# Patient Record
Sex: Male | Born: 1985 | Race: White | Hispanic: No | Marital: Single | Smoking: Never smoker
Health system: Southern US, Community
[De-identification: ages and names within clinical notes are randomized; demographics above are authoritative.]

---

## 2018-05-27 ENCOUNTER — Encounter: Payer: Self-pay | Admitting: Sports Medicine

## 2018-05-27 ENCOUNTER — Ambulatory Visit: Payer: Self-pay

## 2018-05-27 ENCOUNTER — Ambulatory Visit (INDEPENDENT_AMBULATORY_CARE_PROVIDER_SITE_OTHER): Payer: PRIVATE HEALTH INSURANCE | Admitting: Sports Medicine

## 2018-05-27 ENCOUNTER — Ambulatory Visit (INDEPENDENT_AMBULATORY_CARE_PROVIDER_SITE_OTHER): Payer: PRIVATE HEALTH INSURANCE

## 2018-05-27 VITALS — BP 120/76 | HR 59 | Ht 69.0 in | Wt 157.2 lb

## 2018-05-27 DIAGNOSIS — M71331 Other bursal cyst, right wrist: Secondary | ICD-10-CM | POA: Diagnosis not present

## 2018-05-27 DIAGNOSIS — M25531 Pain in right wrist: Secondary | ICD-10-CM | POA: Diagnosis not present

## 2018-05-27 NOTE — Progress Notes (Signed)
PROCEDURE NOTE:  Ultrasound Guided: Injection: Right wrist, synovial cyst Images were obtained and interpreted by myself, Gaspar BiddingMichael Takuma Cifelli, DO  Images have been saved and stored to PACS system. Images obtained on: GE S7 Ultrasound machine    ULTRASOUND FINDINGS:  Synovial cyst measuring 0.25 x 0.66 cm in size across the dorsum of the wrist.  Otherwise no significant osseous irregularities appreciated on ultrasound.  DESCRIPTION OF PROCEDURE:  The patient's clinical condition is marked by substantial pain and/or significant functional disability. Other conservative therapy has not provided relief, is contraindicated, or not appropriate. There is a reasonable likelihood that injection will significantly improve the patient's pain and/or functional impairment.   After discussing the risks, benefits and expected outcomes of the injection and all questions were reviewed and answered, the patient wished to undergo the above named procedure.  Verbal consent was obtained.  The ultrasound was used to identify the target structure and adjacent neurovascular structures. The skin was then prepped in sterile fashion and the target structure was injected under direct visualization using sterile technique as below:  Single injection performed as below: PREP: Alcohol and Ethel Chloride APPROACH:direct, single injection, 25g 1.5 in. INJECTATE: 0.5 cc 0.5% Marcaine and 0.5 cc 80mg /mL DepoMedrol ASPIRATE: None DRESSING: Band-Aid  Post procedural instructions including recommending icing and warning signs for infection were reviewed.    This procedure was well tolerated and there were no complications.   IMPRESSION: Succesful Ultrasound Guided: Injection

## 2018-05-27 NOTE — Progress Notes (Signed)
Christian FellsMichael D. Delorise Shinerigby, DO  [facility] [place] [phone]  Christian SayersMason Delgado - 32 y.o. male MRN [id number]  Date of birth: 1986/01/29  Visit Date: 05/27/2018  PCP: Patient, No Pcp Per   Referred by: No ref. provider found  Scribe(s) for today's visit: Christoper FabianMolly Weber, LAT, ATC  SUBJECTIVE:  Christian SayersMason Delgado is here for New Patient (Initial Visit) (R wrist pain) .    His R wrist pain symptoms INITIALLY: Began a few months ago w/ no known MOI.  He noticed while doing push-ups and now has a hard time being in a loaded, extended wrist position.  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 He has been taking Curaphen, a turmeric supplement.   REVIEW OF SYSTEMS: Denies night time disturbances. Denies fevers, chills, or night sweats. Denies unexplained weight loss. Denies personal history of cancer. Denies changes in bowel or bladder habits. Denies recent unreported falls. Denies new or worsening dyspnea or wheezing. Denies headaches or dizziness.  Reports numbness, tingling or weakness  In the extremities - in the R hand Denies dizziness or presyncopal episodes Denies lower extremity edema    HISTORY & PERTINENT PRIOR DATA:  Prior History reviewed and updated per electronic medical record.  Significant/pertinent history, findings, studies include:  reports that he has never smoked. He has never used smokeless tobacco. No results for input(s): HGBA1C, LABURIC, CREATINE in the  last 8760 hours. No specialty comments available. Problem  Right Wrist Pain    OBJECTIVE:  VS:  HT:5\' 9"  (175.3 cm)   WT:157 lb 3.2 oz (71.3 kg)  BMI:23.2    BP:120/76  HR:(Abnormal) 59bpm  TEMP: ( )  RESP:97 %   PHYSICAL EXAM: Constitutional: WDWN, Non-toxic appearing. Psychiatric: Alert & appropriately interactive.  Not depressed or anxious appearing. Respiratory: No increased work of breathing.  Trachea Midline Eyes: Pupils are equal.  EOM intact without nystagmus.  No scleral icterus  Vascular Exam: warm to touch no edema  upper extremity neuro exam: unremarkable normal strength normal sensation  MSK Exam: Right wrist is overall well aligned he does have a slight radial positive variance.  Is a slight prominence of the dorsum of the wrist without significant focal pain.  He has approximately 10 to 15 degrees of wrist extension limitation on the right compared to the left and slight amount of pain with this.  Negative piano test.  Grip strength is intact.  Radial pulses 2+/4.   ASSESSMENT & PLAN:   1. Right wrist pain   2. Synovial cyst of right wrist     PLAN: X-rays reviewed today that show slight radial positive variance.  MSK ultrasound does reveal a synovial cyst and ultrasound-guided injection performed today.  Compression with body Helix compression sleeve recommended.  Avoidance of significant wrist extension recommended.  Given his underlying radial positive variance avoiding repetitive wrist extension is recommended going forward but this should not prohibit him from participating in his [employer] duties.  Anticipate full resolution of the wrist  synovial cyst with injection and will have him come back in 6 weeks to ensure this.  Recommend compression to be used on a daily basis.  Follow-up: Return in about 6 weeks (around 07/08/2018).      Please see additional documentation for Objective, Assessment and Plan sections. Pertinent additional documentation may  be included in corresponding procedure notes, imaging studies, problem based documentation and patient instructions. Please see these sections of the encounter for additional information regarding this visit.  CMA/ATC served as Neurosurgeon during this visit. History, Physical, and Plan performed by medical provider. Documentation and orders reviewed and attested to.      Christian Mews, DO    [facility] Physician

## 2018-05-27 NOTE — Patient Instructions (Addendum)
You had an injection today.  Things to be aware of after injection are listed below: . You may experience no significant improvement or even a slight worsening in your symptoms during the first 24 to 48 hours.  After that we expect your symptoms to improve gradually over the next 2 weeks for the medicine to have its maximal effect.  You should continue to have improvement out to 6 weeks after your injection. . Dr. Rigby recommends icing the site of the injection for 20 minutes  1-2 times the day of your injection . You may shower but no swimming, tub bath or Jacuzzi for 24 hours. . If your bandage falls off this does not need to be replaced.  It is appropriate to remove the bandage after 4 hours. . You may resume light activities as tolerated unless otherwise directed per Dr. Rigby during your visit  POSSIBLE STEROID SIDE EFFECTS:  Side effects from injectable steroids tend to be less than when taken orally however you may experience some of the symptoms listed below.  If experienced these should only last for a short period of time. Change in menstrual flow  Edema (swelling)  Increased appetite Skin flushing (redness)  Skin rash/acne  Thrush (oral) Yeast vaginitis    Increased sweating  Depression Increased blood glucose levels Cramping and leg/calf  Euphoria (feeling happy)  POSSIBLE PROCEDURE SIDE EFFECTS: The side effects of the injection are usually fairly minimal however if you may experience some of the following side effects that are usually self-limited and will is off on their own.  If you are concerned please feel free to call the office with questions:  Increased numbness or tingling  Nausea or vomiting  Swelling or bruising at the injection site   Please call our office if if you experience any of the following symptoms over the next week as these can be signs of infection:   Fever greater than 100.5F  Significant swelling at the injection site  Significant redness or drainage  from the injection site  If after 2 weeks you are continuing to have worsening symptoms please call our office to discuss what the next appropriate actions should be including the potential for a return office visit or other diagnostic testing.  I recommend you obtained a compression sleeve to help with your joint problems. There are many options on the market however I recommend obtaining a wrist Body Helix compression sleeve.  You can find information (including how to appropriate measure yourself for sizing) can be found at www.Body Helix.com.  Many of these products are health savings account (HSA) eligible.   You can use the compression sleeve at any time throughout the day but is most important to use while being active as well as for 2 hours post-activity.   It is appropriate to ice following activity with the compression sleeve in place.    

## 2018-07-08 ENCOUNTER — Ambulatory Visit (INDEPENDENT_AMBULATORY_CARE_PROVIDER_SITE_OTHER): Payer: PRIVATE HEALTH INSURANCE | Admitting: Sports Medicine

## 2018-07-08 ENCOUNTER — Encounter: Payer: Self-pay | Admitting: Sports Medicine

## 2018-07-08 ENCOUNTER — Telehealth: Payer: Self-pay | Admitting: Sports Medicine

## 2018-07-08 VITALS — BP 100/72 | HR 60 | Ht 69.0 in | Wt 156.0 lb

## 2018-07-08 DIAGNOSIS — M71331 Other bursal cyst, right wrist: Secondary | ICD-10-CM | POA: Diagnosis not present

## 2018-07-08 DIAGNOSIS — M25531 Pain in right wrist: Secondary | ICD-10-CM

## 2018-07-08 MED ORDER — DICLOFENAC SODIUM 2 % TD SOLN: 1.0000 "application " | Freq: Two times a day (BID) | TRANSDERMAL | 0 refills | Status: AC

## 2018-07-08 MED ORDER — DICLOFENAC SODIUM 2 % TD SOLN: 1.0000 "application " | Freq: Two times a day (BID) | TRANSDERMAL | 2 refills | Status: DC

## 2018-07-08 NOTE — Patient Instructions (Signed)
[facility] instructions for [facility], [facility] and [facility]:  Your prescription will be filled through a mail order [facility].  It is typically [facility] but may vary depending on where you live.  You will receive a phone call from them which will typically come from a [phone] phone number.  You must speak directly to them to have this medication filled.  When the Delgado calls, they will need your mailing address (for overnight shipment of the medication) andy they will need payment information if you have a copay (typically no more than $10). If you have not heard from them 2-3 days after your appointment with Dr. Rigby, contact us at the office ([phone]) or through MyChart so we can reach back out to the Delgado.    [facility] instructions: You have been given a sample/prescription for [facility], a topical medication.     You are to apply this gel to your injured body part twice daily (morning and evening).   A little goes a long way so you can use about a pea-sized amount for each area.   Spread this small amount over the area into a thin film and let it dry.   Be sure that you do not rub the gel into your skin for more than 10 or 15 seconds otherwise it can irritate you skin.    Once you apply the gel, please do not put any other lotion or clothing in contact with that area for 30 minutes to allow the gel to absorb into your skin.   Some people are sensitive to the medication and can develop a sunburn-like rash.  If you have only mild symptoms it is okay to continue to use the medication but if you have any breakdown of your skin you should discontinue its use and please let us know.   If you have been written a prescription for [facility], you will receive a pump bottle of this topical gel through a mail order [facility].  The instructions on the bottle will say to apply two pumps twice a day which may be too much gel for your particular area so use the pea-sized amount as your  guide.  

## 2018-07-08 NOTE — Progress Notes (Signed)
Christian FellsMichael D. Delorise Shinerigby, DO  [facility] [place] [phone]  Christian SayersMason [facility] - 32 y.o. male MRN [id number]  Date of birth: 03/15/1986  Visit Date: 07/08/2018  PCP: Patient, No Pcp Per   Referred by: No ref. provider found  Scribe(s) for today's visit: Christian FabianMolly [facility], LAT, ATC  SUBJECTIVE:  Christian SayersMason Delgado is here for Follow-up (R wrist pain) .    His R wrist pain symptoms INITIALLY: Began a few months ago w/ no known MOI.  He noticed while doing push-ups and now has a hard time being in a loaded, extended wrist position.  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 He has been taking Curaphen, a turmeric supplement.  07/08/2018: Compared to the last office visit on 05/27/18, his previously described R wrist pain symptoms are improving w/ noted improved R wrist mobility.  He states that he hasn't tried push-ups over the past 6 weeks. Current symptoms are mild & are nonradiating.  He feels more tightness along the radial and ulnar side of his wrist. Had a R wrist injection at his last visit and responded well to this.  He is taking IBU prn.   REVIEW OF SYSTEMS: Denies night time disturbances. Denies fevers, chills, or night sweats. Denies unexplained weight loss. Denies personal history of cancer. Denies changes in bowel or bladder habits. Denies recent unreported falls. Denies new or worsening dyspnea or wheezing. Denies headaches or dizziness.  Denies numbness, tingling or  weakness  In the extremities.  Denies dizziness or presyncopal episodes Denies lower extremity edema    HISTORY & PERTINENT PRIOR DATA:  Prior History reviewed and updated per electronic medical record.  Significant/pertinent history, findings, studies include:  reports that he has never smoked. He has never used smokeless tobacco. No results for input(s): HGBA1C, LABURIC, CREATINE in the last 8760 hours. No specialty comments available. No problems updated.  OBJECTIVE:  VS:  HT:5\' 9"  (175.3 cm)   WT:156 lb (70.8 kg)  BMI:23.03    BP:100/72  HR:60bpm  TEMP: ( )  RESP:96 %   PHYSICAL EXAM: Constitutional: WDWN, Non-toxic appearing. Psychiatric: Alert & appropriately interactive.  Not depressed or anxious appearing. Respiratory: No increased work of breathing.  Trachea Midline Eyes: Pupils are equal.  EOM intact without nystagmus.  No scleral icterus  Vascular Exam: warm to touch no edema  upper extremity neuro exam: unremarkable normal strength normal sensation  MSK Exam: Right wrist is overall well aligned.  Prior prominence across the dorsum of the wrist is significantly diminished although still slightly larger than left.  There is no significant pain with palpation.  Good grip strength and normal ulnar and radial deviation.  No appreciable reproducible clicking.   PROCEDURES & DATA REVIEWED:  n/a  ASSESSMENT & PLAN:   1. Right wrist pain   2. Synovial cyst of right wrist     PLAN: He is doing significantly better and has had complete resolution of his pain symptoms.  We will have him return  to activities as tolerated with slow progression back to neutral push-ups.  For any residual soreness and pain that may be exacerbated with return to activities he will prescription of Pennsaid to be used as needed at this point his cyst has resolved and there is no contraindications to his participation in [employer].   Follow-up: Return if symptoms worsen or fail to  improve.      Please see additional documentation for Objective, Assessment and Plan sections. Pertinent additional documentation may be included in corresponding procedure notes, imaging studies, problem based documentation and patient instructions. Please see these sections of the encounter for additional information regarding this visit.  CMA/ATC served as Neurosurgeonscribe during this visit. History, Physical, and Plan performed by medical provider. Documentation and orders reviewed and attested to.      Andrena MewsMichael D [name], DO     Sports Medicine Physician

## 2018-07-08 NOTE — Telephone Encounter (Signed)
Copied from CRM [phone]. Topic: Quick Communication - See Telephone Encounter >> Jul 08, 2018  3:49 PM Arlyss Gandyichardson, Sanyia Dini N, NT wrote: CRM for notification. See Telephone encounter for: 07/08/18. Pt states that his insurance will not cover the Diclofenac Sodium (PENNSAID) 2 % SOLN and he would like to see what his other options may be.

## 2018-07-08 NOTE — Telephone Encounter (Signed)
See note

## 2018-07-10 ENCOUNTER — Other Ambulatory Visit: Payer: Self-pay

## 2018-07-10 MED ORDER — DICLOFENAC SODIUM 1 % TD GEL: TRANSDERMAL | 2 refills | Status: DC

## 2018-07-10 NOTE — Telephone Encounter (Signed)
Spoke with Marlene BastMason and advised that Dr. Berline Choughigby has sent in new rx for topical voltaren gel to his local pharmacy. I also advised that insurance may not cover this either but he can check goodrx for savings card. He reports that he may or may not pick medication up, he is not having much pain now. No further action needed at this time.

## 2018-07-19 ENCOUNTER — Telehealth: Payer: Self-pay | Admitting: Sports Medicine

## 2018-07-19 NOTE — Telephone Encounter (Signed)
Pt needing a call back to discuss his wrist and using it to keep from future injury. Pt wants to know if Berline ChoughRigby can do some testing labs for him as well. Please call pt back to discuss.

## 2018-07-19 NOTE — Telephone Encounter (Signed)
Patient called and states he missed a call from EminenceMolly .Please call

## 2018-07-19 NOTE — Telephone Encounter (Signed)
See note

## 2018-07-19 NOTE — Telephone Encounter (Signed)
Returned pt's call and LM for him to return call to discuss his wrist as well as lab tests that he would like.

## 2018-08-22 ENCOUNTER — Emergency Department (HOSPITAL_COMMUNITY)
Admission: EM | Admit: 2018-08-22 | Discharge: 2018-08-22 | Disposition: A | Discharge status: Home / Self Care | Payer: No Typology Code available for payment source | Attending: Emergency Medicine | Admitting: Emergency Medicine

## 2018-08-22 ENCOUNTER — Emergency Department (HOSPITAL_COMMUNITY): Payer: No Typology Code available for payment source

## 2018-08-22 ENCOUNTER — Encounter (HOSPITAL_COMMUNITY): Payer: Self-pay | Admitting: *Deleted

## 2018-08-22 ENCOUNTER — Other Ambulatory Visit: Payer: Self-pay

## 2018-08-22 DIAGNOSIS — Z79899 Other long term (current) drug therapy: Secondary | ICD-10-CM | POA: Insufficient documentation

## 2018-08-22 DIAGNOSIS — R002 Palpitations: Secondary | ICD-10-CM | POA: Insufficient documentation

## 2018-08-22 LAB — CBC
HCT: 45.6 % (ref 39.0–52.0)
Hemoglobin: 16.3 g/dL (ref 13.0–17.0)
MCH: 32.5 pg (ref 26.0–34.0)
MCHC: 35.7 g/dL (ref 30.0–36.0)
MCV: 90.8 fL (ref 78.0–100.0)
Platelets: 231 10*3/uL (ref 150–400)
RBC: 5.02 MIL/uL (ref 4.22–5.81)
RDW: 12.6 % (ref 11.5–15.5)
WBC: 4.1 10*3/uL (ref 4.0–10.5)

## 2018-08-22 LAB — POCT I-STAT TROPONIN I: Troponin i, poc: 0 ng/mL (ref 0.00–0.08)

## 2018-08-22 LAB — BASIC METABOLIC PANEL
Anion gap: 10 (ref 5–15)
BUN: 16 mg/dL (ref 6–20)
CO2: 29 mmol/L (ref 22–32)
Calcium: 10 mg/dL (ref 8.9–10.3)
Chloride: 105 mmol/L (ref 98–111)
Creatinine, Ser: 0.97 mg/dL (ref 0.61–1.24)
GFR calc Af Amer: >60 mL/min (ref >60)
GFR calc non Af Amer: >60 mL/min (ref >60)
Glucose, Bld: 79 mg/dL (ref 70–99)
Potassium: 4.1 mmol/L (ref 3.5–5.1)
Sodium: 144 mmol/L (ref 135–145)

## 2018-08-22 NOTE — Discharge Instructions (Signed)
Your work-up in the emergency department today was reassuring.  We avoid use of stimulants such as caffeine.  Should you experience persistent palpitations, we advise follow-up with cardiology.  You may benefit from use of a Holter monitor.  You may return to the emergency department as needed for new or concerning symptoms.

## 2018-08-22 NOTE — ED Triage Notes (Signed)
Pt says last night around 1700 he started feeling fatigued, body aches. About noon today, he started having some palpitations, chest pressure, shortness of breath and lightheadedness- lasting all afternoon.

## 2018-08-22 NOTE — ED Provider Notes (Addendum)
[facility]   CSN: [id number] Arrival date & time: 08/22/18  1804    History   Chief Complaint No chief complaint on file.   HPI Christian [facility] is a 32 y.o. male.  32 year old male presents to the emergency department for evaluation of palpitations.  Patient states that he had onset of palpitations at noon today.  Describes his palpitations as his "heart skipping a beat".  States that this would cause him to reflexively cough.  Symptoms persisted for approximately 5 hours, but began to subside shortly after arriving to the emergency department.  Notes some associated lightheadedness.  Denies any syncope, upper respiratory symptoms, fever.  Further denies use of hormone replacement, recent travel, recent surgeries or hospitalizations.  No unilateral leg swelling or hemoptysis.  No distinct viral illness, though he did have a feeling of fatigue while at work yesterday around 1500 accompanied by body aches.  Has not used any stimulants today.  Never felt that his heart was beating rapidly.  No FHx of ACS or sudden cardiac death.  The history is provided by the patient. No language interpreter was used.    History reviewed. No pertinent past medical history.  Patient Active Problem List   Diagnosis Date Noted  . Right wrist pain 05/27/2018    History reviewed. No pertinent surgical history.      Home Medications    Prior to Admission medications   Medication Sig Start Date End Date Taking? Authorizing Provider  Multiple Vitamin (MULTIVITAMIN WITH MINERALS) TABS tablet Take 1 tablet by mouth daily.   Yes [provider]  diclofenac sodium (VOLTAREN) 1 % GEL Apply topically to affected area qid Patient not taking: Reported on 08/22/2018 07/10/18   Andrena Mews, DO    Family History No family history on file.  Social History Social History   Tobacco Use  . Smoking status: Never Smoker  . Smokeless tobacco: Never  Used  Substance Use Topics  . Alcohol use: Yes  . Drug use: Never     Allergies   Patient has no known allergies.   Review of Systems Review of Systems Ten systems reviewed and are negative for acute change, except as noted in the HPI.    Physical Exam Updated Vital Signs BP 140/86 (BP Location: Right Arm)   Pulse (!) 57   Temp 98 F (36.7 C) (Oral)   Resp 15   Ht 5\' 9"  (1.753 m)   Wt 69.9 kg   SpO2 98%   BMI 22.77 kg/m   Physical Exam  Constitutional: He is oriented to person, place, and time. He appears well-developed and well-nourished. No distress.  Nontoxic appearing and in NAD  HENT:  Head: Normocephalic and atraumatic.  Eyes: Conjunctivae and EOM are normal. No scleral icterus.  Neck: Normal range of motion.  Cardiovascular: Normal rate, regular rhythm and intact distal pulses.  Mild bradycardia  Pulmonary/Chest: Effort normal. No stridor. No respiratory distress. He has no wheezes. He has no rales.  Respirations even and unlabored. Lungs CTAB.  Musculoskeletal: Normal range of motion.  Neurological: He is alert and oriented to person, place, and time. He exhibits normal muscle tone. Coordination normal.  Skin: Skin is warm and dry. No rash noted. He is not diaphoretic. No erythema. No pallor.  Psychiatric: He has a normal mood and affect. His behavior is normal.  Nursing note and vitals reviewed.    ED Treatments / Results  Labs (all labs ordered are listed, but  only abnormal results are displayed) Labs Reviewed  BASIC METABOLIC PANEL  CBC  I-STAT TROPONIN, ED  POCT I-STAT TROPONIN I    EKG ED ECG REPORT   Date: 08/22/2018  Rate: 62  Rhythm: normal sinus rhythm  QRS Axis: normal  Intervals: normal  ST/T Wave abnormalities: normal  Conduction Disutrbances:none  Narrative Interpretation: Normal sinus rhythm  Old EKG Reviewed: none available  I have personally reviewed the EKG tracing and agree with the computerized printout as  noted.   Radiology Dg Chest 2 View  Result Date: 08/22/2018 CLINICAL DATA:  Palpitations and shortness of breath today. EXAM: CHEST - 2 VIEW COMPARISON:  None. FINDINGS: The heart size and mediastinal contours are within normal limits. Both lungs are clear. The visualized skeletal structures are unremarkable. IMPRESSION: No active cardiopulmonary disease. Electronically Signed   By: Sherian Rein M.D.   On: 08/22/2018 19:39    Procedures Procedures (including critical care time)  Medications Ordered in ED Medications - No data to display   Initial Impression / Assessment and Plan / ED Course  I have reviewed the triage vital signs and the nursing notes.  Pertinent labs & imaging results that were available during my care of the patient were reviewed by me and considered in my medical decision making (see chart for details).     32 year old male presents to the emergency department for evaluation of palpitations.  Found to be in normal sinus rhythm on arrival.  Felt as though his heart was "skipping a beat".  Has been stable while on the monitor in the exam room.  Is largely asymptomatic at this time.  Laboratory work-up reassuring without leukocytosis or electrolyte derangements.  Troponin is negative.  Chest x-ray without evidence of acute cardiopulmonary abnormality.  He is PERC negative.  Did experience some fatigue and body aches yesterday which may indicate viral illness, though denies fatigue and body aches today.  Could also be related to stress as patient is currently enrolling in the [employer].  May benefit from outpatient holter monitor, but I do not believe further emergent work up is presently indicated.  Will provide cardiology referral for follow up.  Return precautions discussed and provided. Patient discharged in stable condition with no unaddressed concerns.   Final Clinical Impressions(s) / ED Diagnoses   Final diagnoses:  Palpitations    ED Discharge Orders    None        Antony Madura, PA-C 08/22/18 2311    Antony Madura, PA-C 08/22/18 2312    Benjiman Core, MD 08/22/18 2340

## 2018-08-30 ENCOUNTER — Telehealth: Payer: Self-pay | Admitting: Sports Medicine

## 2018-08-30 NOTE — Telephone Encounter (Signed)
Address the mail to ATTN: Claims Department

## 2018-08-30 NOTE — Telephone Encounter (Signed)
Patient called to discuss his bill he received regarding his 05-27-18 visit and a letter he received from his insurance stating that additional documents are needed for coverage.   I explained that I could call the insurance and communicate with the clinical team on what is needed.   I called and spoke with [employer] and they stated that the medical notes/records from the patient's visit on 05-27-18 is needing to be mailed to the mailing address below as soon as possible. Insurance stated that it takes 30-45 business days to process. Please contact the patient as soon as possible to update on the message above and that the documents have been mailed. Okay to leave a voicemail. Make patient aware that insurance stated that once received it takes 30-45 business days to resolve.   [employer] [address] [place], [place] [address]

## 2018-09-02 NOTE — Telephone Encounter (Signed)
Spoke with pt, he will come by the office to sign ROI form and then we can fax it to HIM at [phone]. ROI form at the front desk for pt to complete.

## 2018-09-02 NOTE — Telephone Encounter (Signed)
Called HIM, was advised that pt will need to sign ROI form to have records sent. If records are being sent to the insurance company directly, this will need to be processed through HIM.

## 2018-11-04 ENCOUNTER — Encounter: Payer: Self-pay | Admitting: Sports Medicine

## 2018-11-04 ENCOUNTER — Ambulatory Visit: Payer: PRIVATE HEALTH INSURANCE | Admitting: Sports Medicine

## 2018-11-04 VITALS — BP 120/80 | Ht 69.0 in | Wt 155.0 lb

## 2018-11-04 DIAGNOSIS — M25562 Pain in left knee: Secondary | ICD-10-CM | POA: Diagnosis not present

## 2018-11-04 DIAGNOSIS — G8929 Other chronic pain: Secondary | ICD-10-CM

## 2018-11-04 NOTE — Progress Notes (Addendum)
   Subjective:    Patient ID: Christian RowanMason Grey Delgado, male    DOB: 1986/07/19, 32 y.o.   MRN: [id number]  HPI Christian Delgado is a previously healthy 32yo white male presenting for L knee pain evaluation. Pt reports this is a chronic issue that dates back about 1.6651yrs. At that time he decided to increase his level of activity as started running more gradually. At that point while running he started to develop lateral and posterior L knee pain. Around that same time he also noticed a sudden "pop" and some localized swelling but did not seek evaluation. 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 He also has been doing formal PT as well. He states he was evaluated by ortho in Feb of this years and XR at that time were neg. PT has been focusing on IT band strengthening. He also reports this past Wednesday he made a sudden pivoting turn and felt another "pop" at work. Denies swelling, locking, catching and knee giving out on him. Ice has helped improve the pain. He tries to avoid medication for pain. The pain is localized not very well but generally is on the lateral and posterior aspect. Pain is sharp and intermittent made worse by squats and running. 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   Review of Systems Constitutional: Negative MSK: As above    Objective:   Physical Exam Alert and well appearing male resting comfortably on exam table MSK: Inspect: No swelling, erythema or obvious deformity Palpation: + TTP over distal IT band and distal biceps femoris where tendon attaches on fibula ROM: L knee flexion and extension intact. Pelvic muscle weakness and abductor muscles weakness noted (positive trendelenberg) Strength: 5/5 BL Neurovascular intact Special tests: + patellar apprehension. McMurray neg. Valgus and varus testing neg. Lachman's neg. Anterior and posterior drawer neg. Ober's neg. Thessaly's  neg  Evaluation of his running gait shows excellent form.  He has a first ray post in his right shoe to correct pronation.  With orthotic in place, his foot strike is neutral.  No limp.       Assessment & Plan:  Christian Delgado is a previously healthy 32yo white male presenting for L knee pain evaluation.  Plan: -MRI L knee to evaluate for meniscus tear vs distal biceps femoris tendinopathy -Ice/heat as directed -Aleve prn for pain -Activity modification as discussed -Continue formal PT for now with exercises aimed at abductor muscles strengthening -Increase activity as tolerated -Education provided  RTC after MRI results obtained.  Staff: Dr. Maree Erieraper  Christian Wilen, MD FP, PGY-3  Patient seen and evaluated with the resident.  I agree with the above plan of care.  Patient has chronic lateral knee pain that has been unresponsive to treatment including several weeks of formal physical therapy.  X-rays at an outside facility were unremarkable.  Based on the chronicity of symptoms, I think it is reasonable to pursue further diagnostic imaging in the form of an MRI specifically to rule out a lateral meniscal tear versus possible distal biceps femoris tendinopathy.  Patient has obvious hip abductor weakness and a positive Trendelenburg on today's exam.  We did show him a few home exercises but I think he would benefit from continued treatment with Dr. Ellamae SiaJohn O'Halloran.  Phone follow-up with MRI findings when available.  We will delineate further treatment based on those findings.

## 2018-11-06 ENCOUNTER — Ambulatory Visit
Admission: RE | Admit: 2018-11-06 | Discharge: 2018-11-06 | Disposition: A | Discharge status: Home / Self Care | Payer: PRIVATE HEALTH INSURANCE | Source: Ambulatory Visit | Attending: Sports Medicine | Admitting: Sports Medicine

## 2018-11-06 DIAGNOSIS — M25562 Pain in left knee: Principal | ICD-10-CM

## 2018-11-06 DIAGNOSIS — G8929 Other chronic pain: Secondary | ICD-10-CM

## 2018-11-11 ENCOUNTER — Telehealth: Payer: Self-pay | Admitting: Sports Medicine

## 2018-11-11 ENCOUNTER — Telehealth: Payer: Self-pay | Admitting: General Practice

## 2018-11-11 NOTE — Telephone Encounter (Signed)
  I spoke with the patient on the phone today after reviewing MRI findings of his left knee.  It is a fairly unremarkable MRI.  I still think his focus of pain is around the distal biceps femoris.  I would like for him to return to [name] for some focused physical therapy in this area.  His physical exam showed that he has pelvic stabilizer weakness which may also be contributing to this.  In addition, I recommended that he return to the office so that we can do a focused ultrasound evaluation of the biceps femoris tendon.  I did reassure him that he may continue with activity using pain as his guide.

## 2018-11-11 NOTE — Telephone Encounter (Signed)
Patient needing MRI results asap please, thanks

## 2018-11-26 ENCOUNTER — Ambulatory Visit: Payer: PRIVATE HEALTH INSURANCE | Admitting: Sports Medicine

## 2018-11-26 ENCOUNTER — Ambulatory Visit: Payer: Self-pay

## 2018-11-26 VITALS — BP 122/78 | Ht 69.0 in | Wt 155.0 lb

## 2018-11-26 DIAGNOSIS — G8929 Other chronic pain: Secondary | ICD-10-CM | POA: Diagnosis not present

## 2018-11-26 DIAGNOSIS — M25532 Pain in left wrist: Secondary | ICD-10-CM | POA: Diagnosis not present

## 2018-11-26 DIAGNOSIS — M25562 Pain in left knee: Secondary | ICD-10-CM

## 2018-11-26 MED ORDER — NITROGLYCERIN 0.2 MG/HR TD PT24: MEDICATED_PATCH | TRANSDERMAL | 1 refills | Status: DC

## 2018-11-26 NOTE — Progress Notes (Signed)
HPI  CC: Left hamstring pain  Christian Delgado is a 32 year old male who presents for follow-up of left hamstring pain.  He was previously seen for this issue on 11/04/2018.  At that time he had an MRI obtained that showed no significant tears of the hamstring.  He states he continues to have pain in his medial knee going up through his hamstrings.  He is working with PT with focus stretching and strengthening of his hip abductors and flexors.  He states this has helped somewhat with the pain.  He takes ibuprofen as needed for pain.  The pain does not radiate to the areas of his body.  He has no new injury to the area.  He has not been running since initial injury.  He also reports left wrist pain.  He states his pain is been going on for around 1 month.  He states he feels a pop in the ulnar side of his wrist when he removes it and supination and pronation.  He states it happens only occasionally.  He sometimes has some associated pain with it.  He states this made worse when he ulnar deviates his wrist.  He has no prior injury to the area.  He is not do any repetitive motions.  He has no numbness tingling in his hand.  He denies any weakness of his hand grip.  See HPI and/or previous note for associated ROS.  Objective: BP 122/78   Ht 5\' 9"  (1.753 m)   Wt 155 lb (70.3 kg)   BMI 22.89 kg/m  Gen: Right-Hand Dominant. NAD, well groomed, a/o x3, normal affect.  CV: Well-perfused. Warm.  Resp: Non-labored.  Neuro: Sensation intact throughout. No gross coordination deficits.  Gait: Nonpathologic posture, unremarkable stride without signs of limp or balance issues.  Left knee exam: No erythema, warmth, swelling noted.  Tenderness palpation over the fibular head on the posterior side.  Full range of motion knee flexion knee extension.  Strength 5 out of 5 throughout testing.  Hamstring testing 5 out of 5.  Negative ligamentous testing.  Left wrist exam: No erythema, warmth, swelling noted.  Tenderness  palpation over the ECU as it crosses the styloid process of the ulna.  Full range of motion in flexion, extension, abduction, abduction of the wrist.  Strength out of 5 throughout testing.  Some pain with resisted extension of the wrist.  Negative Tinel sign.  ULTRASOUND: Knee, left Diagnostic limited ultrasound imaging obtained of patient's left knee.  - Fibular head: biceps femoris tendon visualized at the attachment on the fibular head, with hypoechoic changes and surrounding fluid.  No obvious tear visualized.  IMPRESSION: findings consistent with tendinitis of the biceps femoris tendon.   ULTRASOUND: wrist, left Diagnostic limited ultrasound imaging obtained of patient's left wrist.  - Ulnar styloid: ECU tendon visualized in short axis in the groove of the ulnar styloid.  There was surrounding fluid in a bull's-eye distribution around the ECU tendon.  Tendon visualized on dynamic testing with supination pronation of the wrist, without signs of subluxation.  IMPRESSION: findings consistent with sprain of the ECU tendon of the left wrist.  Assessment and plan: 1.  Tendinitis of the left biceps femoris tendon 2.  Sprain of the ECU of the left hand  We discussed treatment options at today's visit.  We will start him on nitroglycerin patch protocol on his left hamstring injury.  He is to place it over the attachment site at the biceps femoris tendon.  He continue  with PT at this time as well to work on strengthening conditioning of his hip abductors and stabilizers.  For his ECU strain, we will place him in a compression body helix wrist sleeve.  We will see him for follow-up in 6 weeks.  If he has no improvement that time, I will consider re-ultrasounding in comparing to previous results.  Alric QuanBlake Kassidie Hendriks, MD [place] [facility] Fellow 11/26/2018 1:30 PM   Patient seen and evaluated with the sports medicine fellow.  I agree with the above plan of care.  Ultrasound does show some  hypoechoic changes in the deep fibers of the left biceps femoris tendon.  Proceed with treatment as above including topical nitroglycerin and continuing with physical therapy to strengthen hip abductors and pelvic stabilizers.  Follow-up with me again in 6 weeks for reevaluation.  I explained to the patient that this is a chronic problem that will take several weeks before resolving.  He understands.  I recommended no running or jumping until follow-up.  He may swim as symptoms allow.

## 2018-11-26 NOTE — Patient Instructions (Addendum)

## 2019-01-06 ENCOUNTER — Ambulatory Visit: Payer: PRIVATE HEALTH INSURANCE | Admitting: Sports Medicine

## 2019-02-10 ENCOUNTER — Ambulatory Visit: Payer: PRIVATE HEALTH INSURANCE | Admitting: Sports Medicine

## 2019-04-08 ENCOUNTER — Telehealth: Payer: Self-pay | Admitting: Sports Medicine

## 2019-04-08 NOTE — Telephone Encounter (Signed)
  I spoke with Christian Delgado on the phone this morning.  He is still struggling with some posterior left knee pain.  He was last seen in the office on November 26, 2018 and diagnosed with tendinitis of the left biceps femoris tendon at the knee.  Diagnosis was made via ultrasound after an MRI of his knee was unremarkable.  He was working with physical therapy but had to discontinue it due to the COVID-19 pandemic.  He still continues to struggle with pain, especially with running.  Unfortunately, he is without health insurance at this time.  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  I told him that I am happy to see him and reevaluate him and repeat his ultrasound.  In the meantime, I did encourage him to research Nordic hamstring exercises and start trying to do those on a regular basis. Of note, he is continuing to struggle with discomfort from a ganglion cyst in his right wrist.  He would like to meet with a surgeon to discuss cyst excision but, again, he will need to wait until he has acquired health insurance.  I am happy to see him back at whatever time is most convenient for him.

## 2020-06-10 IMAGING — MR MR KNEE*L* W/O CM
7 series · 40 of 40 positions shown · non-contrast
Comparison: None.

CLINICAL DATA: Chronic anterolateral knee pain related to running.
No acute injury or prior relevant surgery.

EXAM:
MRI OF THE LEFT KNEE WITHOUT CONTRAST
TECHNIQUE: Multiplanar, multisequence MR imaging of the knee was performed. No
intravenous contrast was administered.

[Series 5: T2 fat-sat · axial · left · 4.0mm · 0.50mm/px · z∈[-60,+68]mm · 7 of 30 slices shown (1 of 3)]
[im 1/30]
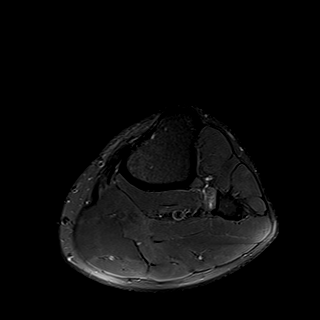
[im 5/30]
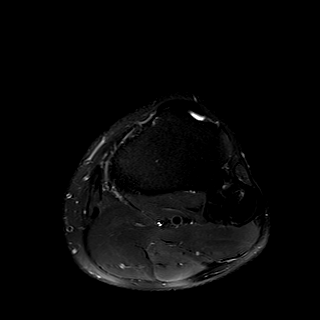
[im 10/30]
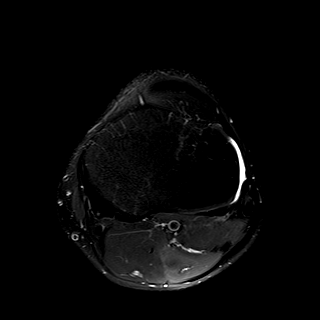
[im 15/30]
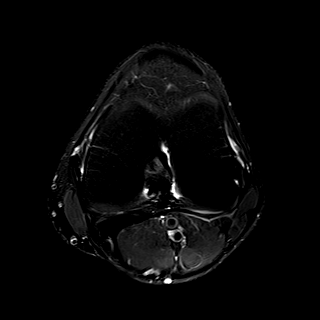
[im 20/30]
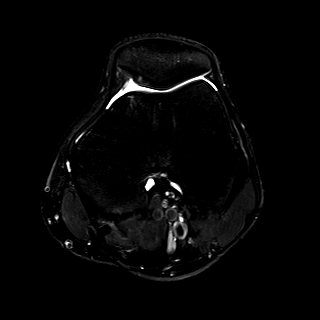
[im 25/30]
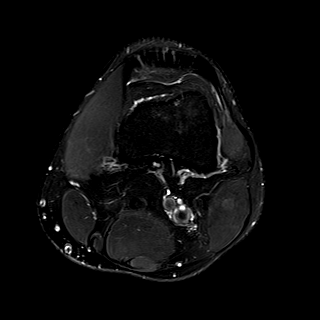
[im 30/30]
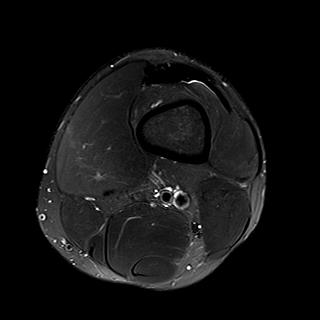

[Series 6: PD fat-sat · sagittal · left · 3.0mm · 0.47mm/px · 6 of 27 slices shown (1 of 2)]
[im 1/27]
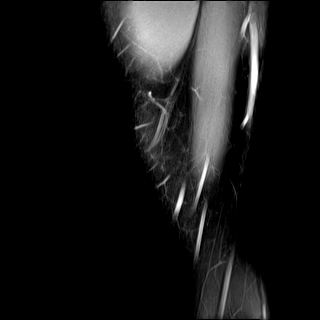
[im 6/27]
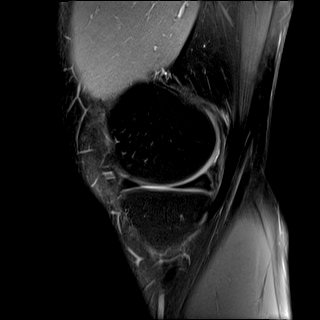
[im 11/27]
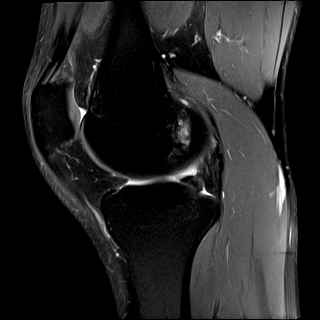
[im 16/27]
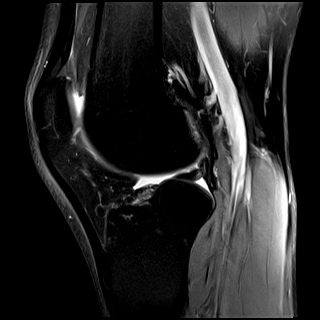
[im 21/27]
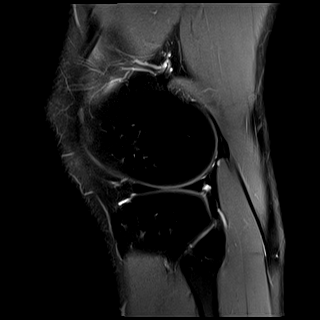
[im 27/27]
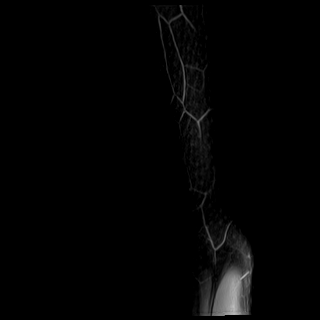

[Series 7: T2 fat-sat · sagittal · left · 3.0mm · 0.47mm/px · 6 of 27 slices shown (2 of 3)]
[im 1/27]
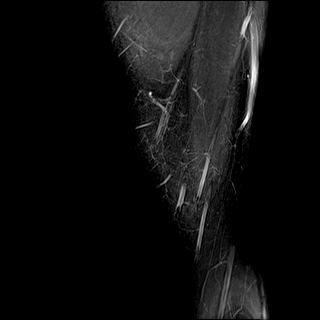
[im 6/27]
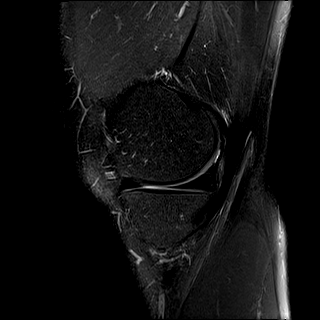
[im 11/27]
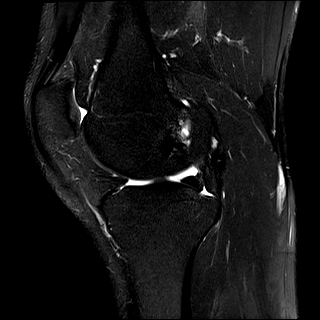
[im 16/27]
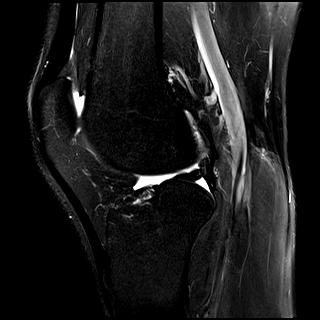
[im 21/27]
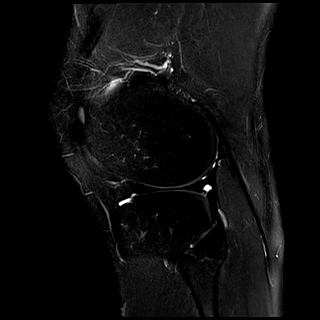
[im 27/27]
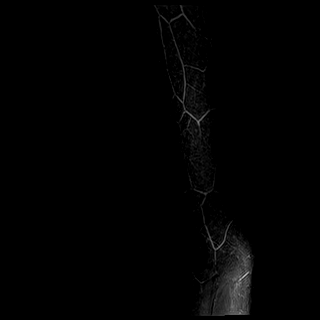

[Series 8: T2 fat-sat · coronal · left · 4.0mm · 0.47mm/px · 6 of 25 slices shown (3 of 3)]
[im 1/25]
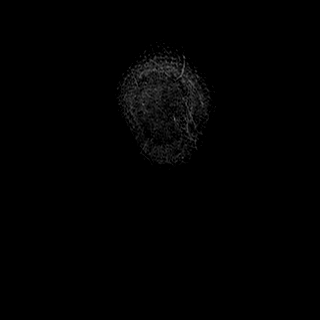
[im 5/25]
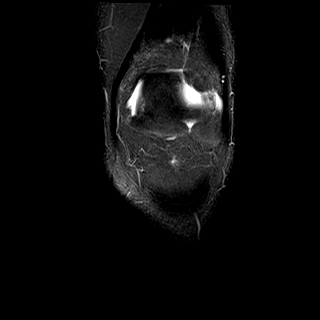
[im 10/25]
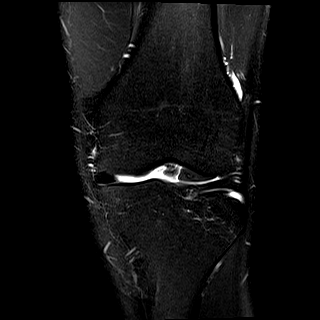
[im 15/25]
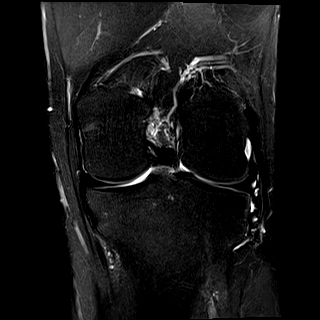
[im 20/25]
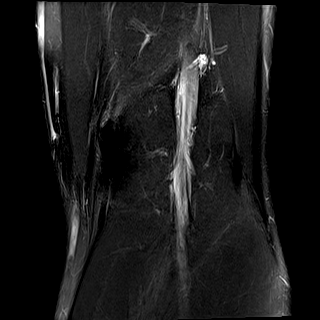
[im 25/25]
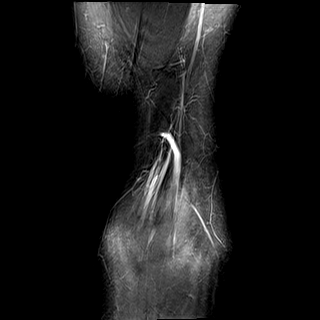

[Series 9: T1 · coronal · left · 4.0mm · 0.47mm/px · 6 of 25 slices shown]
[im 1/25]
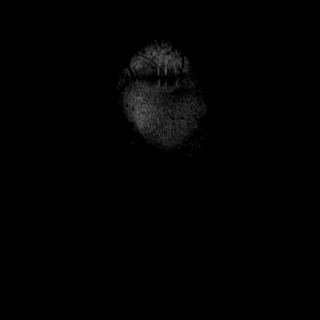
[im 5/25]
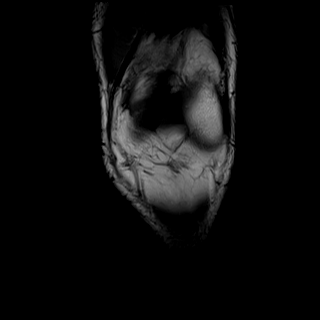
[im 10/25]
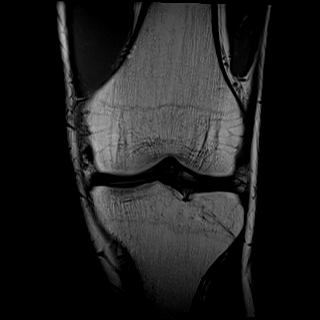
[im 15/25]
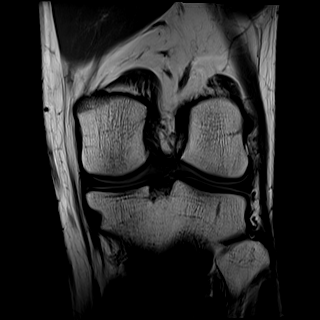
[im 20/25]
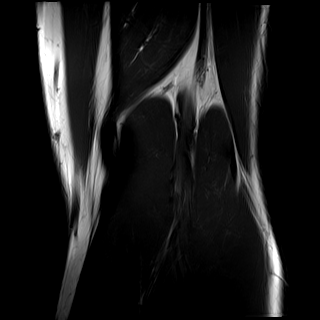
[im 25/25]
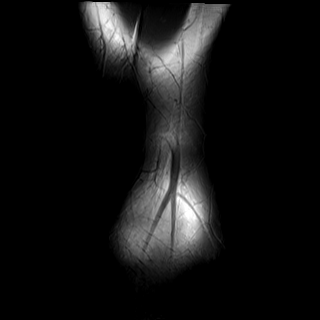

[Series 10: PD fat-sat · coronal · left · 3.0mm · 0.47mm/px · 6 of 28 slices shown (2 of 2)]
[im 1/28]
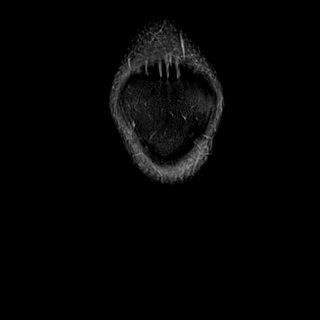
[im 6/28]
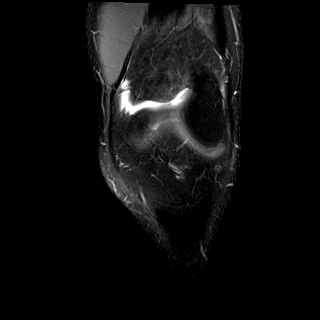
[im 11/28]
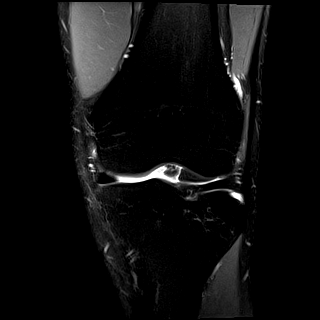
[im 17/28]
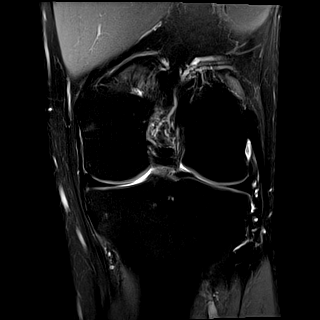
[im 22/28]
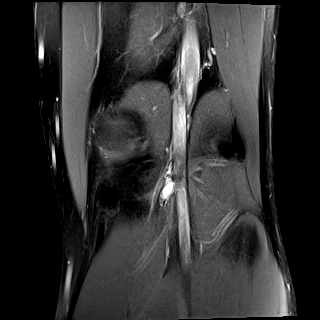
[im 28/28]
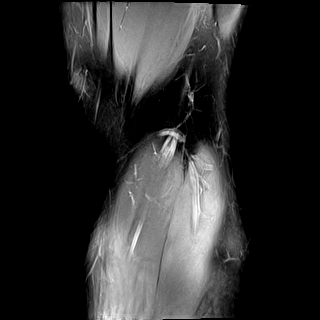

[Series 11: PD · coronal · left · 2.0mm · 0.55mm/px · 3 of 13 slices shown]
[im 1/13]
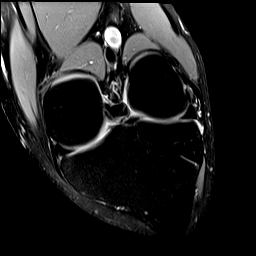
[im 7/13]
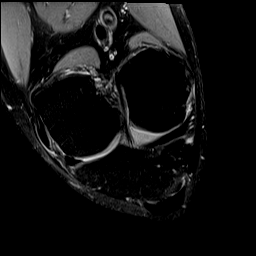
[im 13/13]
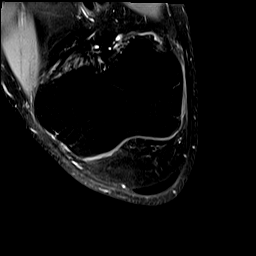

[40 of 40 positions shown; findings below may reference images not displayed]

FINDINGS: MENISCI

Medial meniscus:  Intact with normal morphology.

Lateral meniscus:  Intact with normal morphology.

LIGAMENTS

Cruciates:  Intact.

Collaterals: Intact. There is no thickening of the iliotibial band
or surrounding inflammatory change.

CARTILAGE

Patellofemoral: Possible minimal fissuring of the patellar cartilage
over the medial facet. No subchondral cyst formation.

Medial:  Preserved.

Lateral:  Preserved.

MISCELLANEOUS

Joint:  No significant joint effusion.

Popliteal Fossa:  Unremarkable. No significant Baker's cyst.

Extensor Mechanism:  Intact.

Bones:  No acute or significant extra-articular osseous findings.

Other: No other significant periarticular soft tissue findings.
IMPRESSION: 1. Near normal examination without explanation for the patient's
symptoms. The menisci, cruciate and collateral ligaments are intact.
The iliotibial band appears normal.
2. Possible minimal fissuring of the medial patellar cartilage.

## 2020-07-27 ENCOUNTER — Encounter: Payer: Self-pay | Admitting: Psychiatry

## 2020-07-27 ENCOUNTER — Other Ambulatory Visit: Payer: Self-pay

## 2020-07-27 ENCOUNTER — Ambulatory Visit (INDEPENDENT_AMBULATORY_CARE_PROVIDER_SITE_OTHER): Payer: 59 | Admitting: Psychiatry

## 2020-07-27 DIAGNOSIS — F320 Major depressive disorder, single episode, mild: Secondary | ICD-10-CM | POA: Diagnosis not present

## 2020-07-27 NOTE — Progress Notes (Signed)
[facility] Counselor Initial Adult Exam  Name: Christian Delgado Date: 07/27/2020 MRN: [id number] DOB: Jul 11, 1986 PCP: Patient, No Pcp Per  Time spent: 65 minutes   Guardian/Payee:  None    Paperwork requested:  No   Reason for Visit /Presenting Problem: This is a 34 year old white male who recently has fallen into a major depressive episode.  He has also been having a lot of anxiety with the depression with episodes of anxiety attacks.  He describes having thoughts of death being overwhelmed and angry feelings at wanting to harm others.  He clearly states he has no intent or means to harm others he is just angry at his circumstances.  The client states that his purpose was always to be involved with the [employer].  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  He hurt his hip by pulling his hamstring.  The injury is severe enough that he cannot run without much difficulty at this point.  This has knocked him out of the [employer] which has been disappointing for him.  He has wanted to pursue being a Control and instrumentation engineer so he is applying to an Counsellor school in [place], [place].  He did well and passed the exam with high marks. In the meantime the client had to find a new job to support himself.  He was hired on at the [employer].  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  What he is realized in retrospect that the management environment at his particular store is very toxic.  He sees it as a Sports administrator in nature.  Being bullied and put down are not addressed by the management.  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  He has gotten dressed down by the GM for inconsequential things.  Most recently as the client was starting his day his general manager said something derogatory about him.  The client stated it pushed him over the edge.  He became very overwhelmed and anxious. Goals 1) resolve depression 2) reduce overall anxiety 3)  restructure negative thinking 4) coping skills for depression and anxiety. The client has been treated with EMDR in the past.  Today I used eye-movement with the client focusing on the [facility] management.  His negative cognition is, "I am worthless.  He feels numbness and a fog in his head.  His subjective units of distress is a 9.  As the client processed he became increasingly anxious and sad.  As we processed through his feelings I pointed out that the client has proven numerous times in the past that he has a hard worker that is well regarded.  Much of what has happened at [facility] has tapped in to the bullying he experienced in high school.  The client realized that today.  I suggested that he not let other people with whom he would not even take advice to define who he is.  The client agreed.  His subjective units of distress was less than 5.  The client has also agreed to follow-up with the [facility] medical clinic and talk to the PCP about starting an antidepressant.  He will then follow-up with one of our providers here.  Mental Status Exam:   Appearance:   Well Groomed     Behavior:  Appropriate  Motor:  Normal  Speech/Language:   Clear and Coherent  Affect:  Depressed  Mood:  anxious, depressed and sad  Thought process:  normal  Thought content:    WNL  Sensory/Perceptual disturbances:    WNL  Orientation:  oriented to person, place, time/date and situation  Attention:  Good  Concentration:  Good  Memory:  WNL  Fund of knowledge:   Good  Insight:    Good  Judgment:   Good  Impulse Control:  Good      Reported Symptoms: Anxiety, depression, poor sleep, lack of motivation, anhedonia, thoughts of death, anger, irritability.  Risk Assessment: Danger to Self:  No Self-injurious Behavior: No Danger to Others: No Duty to Warn:no Physical Aggression / Violence:No  Access to Firearms a concern: No  Gang Involvement:No  Patient / guardian was educated about steps to take if suicide or  homicide risk level increases between visits: yes While future psychiatric events cannot be accurately predicted, the patient does not currently require acute inpatient psychiatric care and does not currently meet [facility] involuntary commitment criteria.  Substance Abuse History: Current substance abuse: No     Past Psychiatric History:   Previous psychological history is significant for depression Outpatient Providers:Fred Chaquetta Schlottman, L[facility]CS History of Psych Hospitalization: No  Psychological Testing: None   Abuse History: Victim of No., NA   Report needed: No. Victim of Neglect:No. Perpetrator of NA  Witness / Exposure to Domestic Violence: No   Protective Services Involvement: No  Witness to [employer] Violence:  No   Family History: History reviewed. No pertinent family history.  Living situation: the patient lives alone  Sexual Orientation:  Straight  Relationship Status: single  Name of spouse / other:[name]             If a parent, number of children / ages:0  Support Systems; significant other parents  Financial Stress:  No   Income/Employment/Disability: Employment  Financial planner: Yes   Educational History: Education: Risk manager:   [employer]  Any cultural differences that Feliz Herard affect / interfere with treatment:  not applicable   Recreation/Hobbies: sports, exercise  Stressors:Occupational concerns  Strengths:  Supportive Relationships, Family, Friends, Warehouse manager, Spirituality and Self Advocate  Barriers:  None   Legal History: Pending legal issue / charges: The patient has no significant history of legal issues. History of legal issue / charges: NA  Medical History/Surgical History:not reviewed History reviewed. No pertinent past medical history.  History reviewed. No pertinent surgical history.  Medications: Current Outpatient Medications  Medication Sig Dispense Refill  . Multiple Vitamin (MULTIVITAMIN  WITH MINERALS) TABS tablet Take 1 tablet by mouth daily.    . nitroGLYCERIN (NITRODUR - DOSED IN MG/24 HR) 0.2 mg/hr patch Use 1/4 patch daily to the affected area. 30 patch 1   No current facility-administered medications for this visit.    No Known Allergies  Diagnoses:    ICD-10-CM   1. Major depressive disorder, single episode, mild (HCC)  F32.0     Plan of Care: I will use EMDR to help the client processed through the trauma he has experienced with his work.  We will restructure his negative thoughts and eliminate the negative emotional content as well.  I will teach the client new skills to use and problem-solving strategies.  The client will also follow-up with his primary care physician for medication.  Estimated length of treatment 10-20 sessions.   Gelene Mink Joanthony Hamza, [facility]

## 2020-07-30 ENCOUNTER — Ambulatory Visit (INDEPENDENT_AMBULATORY_CARE_PROVIDER_SITE_OTHER): Payer: 59 | Admitting: Psychiatry

## 2020-07-30 ENCOUNTER — Encounter: Payer: Self-pay | Admitting: Psychiatry

## 2020-07-30 ENCOUNTER — Other Ambulatory Visit: Payer: Self-pay

## 2020-07-30 DIAGNOSIS — F320 Major depressive disorder, single episode, mild: Secondary | ICD-10-CM

## 2020-07-30 NOTE — Progress Notes (Signed)
[facility] Counselor/Therapist Progress Note  Patient ID: Almer Bushey, MRN: [id number],    Date: 07/30/2020  Time Spent: 45 minutes   Treatment Type: Individual Therapy  Reported Symptoms: sad, anxious  Mental Status Exam:  Appearance:   Well Groomed     Behavior:  Appropriate  Motor:  Normal  Speech/Language:   Clear and Coherent  Affect:  Appropriate  Mood:  anxious and sad  Thought process:  normal  Thought content:    WNL  Sensory/Perceptual disturbances:    WNL  Orientation:  oriented to person, place, time/date and situation  Attention:  Good  Concentration:  Good  Memory:  WNL  Fund of knowledge:   Good  Insight:    Good  Judgment:   Good  Impulse Control:  Good   Risk Assessment: Danger to Self:  No Self-injurious Behavior: No Danger to Others: No Duty to Warn:no Physical Aggression / Violence:No  Access to Firearms a concern: No  Gang Involvement:No   Subjective: The client states that he has been offered a job at [employer] in downtown [place].  This is a relief for the client since he does not feel he can return to the toxic work situation at [employer].  He stated he has called [employer] to get the paperwork for short-term leave.  The client also followed up with [facility] and his primary care physician started him on 25 mg of Zoloft with a prescription of hydroxyzine.  He is on day 3 of the Zoloft.  He will call [facility] today to ask about when he can increase the medication.  Today I used eye-movement with the client focusing on his anxiety around the trauma he has experienced at [facility].  His negative cognition is, "I am worthless.  I cannot do anything.  I must be bad."  He feels anxiety in his chest which is at a subjective units of distress of 8.  As the client processed he became sad and tearful at what he experienced with management at [facility].  He states he was dressed down on different occasions by the general manager over minor  issues.  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  When he tried to address it with management his efforts were villainized and the employee who is disrespectful had no consequences.  I discussed with the client how much of this sounds like power and control issues on the part of people that use disrespect as a way to manipulate and control people.  I mention to the client that I have had other people who have worked for [employer] that have experienced similar things.  This was a relief to the client. His subjective units of distress dropped to a 4.  He feels better that he has secured another job. The client still wants to try to get into aviation school in [place], [place].  The cost is around $75,000.  He cannot secure a loan without a cosigner because of his income to debt ratio with student loans.  The client is still hopeful.  He has found the hydroxyzine helpful with his sleep which he will use.  We also discussed the use of radical acceptance in these current circumstances that he has no control over.  His subjective units of distress was less than 4.  Interventions: Assertiveness/Communication, Motivational Interviewing, Solution-Oriented/Positive Psychology, [employer] Desensitization and Reprocessing (EMDR) and Insight-Oriented  Diagnosis:  ICD-10-CM   1. Major depressive disorder, single episode, mild (HCC)  F32.0     Plan: Mood independent behavior, positive self talk, self-care, exercise, follow-up with short-term leave, continue medication, contact PCP, radical acceptance.  Gelene Mink Jhoanna Heyde, [facility]

## 2020-08-30 ENCOUNTER — Telehealth: Payer: Self-pay | Admitting: Psychiatry

## 2020-08-30 NOTE — Telephone Encounter (Signed)
Received fax from [place] on [employer]. [place], DOB 09-21-1986 for leave of absence.

## 2020-09-07 ENCOUNTER — Ambulatory Visit (INDEPENDENT_AMBULATORY_CARE_PROVIDER_SITE_OTHER): Payer: Managed Care, Other (non HMO) | Admitting: Psychiatry

## 2020-09-07 ENCOUNTER — Encounter: Payer: Self-pay | Admitting: Psychiatry

## 2020-09-07 ENCOUNTER — Other Ambulatory Visit: Payer: Self-pay

## 2020-09-07 DIAGNOSIS — F320 Major depressive disorder, single episode, mild: Secondary | ICD-10-CM | POA: Diagnosis not present
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
## 2020-10-13 ENCOUNTER — Ambulatory Visit (INDEPENDENT_AMBULATORY_CARE_PROVIDER_SITE_OTHER): Payer: 59 | Admitting: Psychiatry

## 2020-10-13 ENCOUNTER — Other Ambulatory Visit: Payer: Self-pay

## 2020-10-13 ENCOUNTER — Encounter: Payer: Self-pay | Admitting: Psychiatry

## 2020-10-13 DIAGNOSIS — F320 Major depressive disorder, single episode, mild: Secondary | ICD-10-CM | POA: Diagnosis not present

## 2020-10-13 NOTE — Progress Notes (Signed)
[facility] Counselor/Therapist Progress Note  Patient ID: Christian Delgado, MRN: [id number],    Date: 10/13/2020  Time Spent: 45 minutes   Treatment Type: Individual Therapy  Reported Symptoms: anxiety, sadness  Mental Status Exam:  Appearance:   Casual     Behavior:  Appropriate  Motor:  Normal  Speech/Language:   Clear and Coherent  Affect:  Appropriate  Mood:  anxious and sad  Thought process:  normal  Thought content:    WNL  Sensory/Perceptual disturbances:    WNL  Orientation:  oriented to person, place, time/date and situation  Attention:  Good  Concentration:  Good  Memory:  WNL  Fund of knowledge:   Good  Insight:    Good  Judgment:   Good  Impulse Control:  Good   Risk Assessment: Danger to Self:  No Self-injurious Behavior: No Danger to Others: No Duty to Warn:no Physical Aggression / Violence:No  Access to Firearms a concern: No  Gang Involvement:No   Subjective: The client states that he is recently moved into his own apartment.  He left his last place because his roommate was his ex-girlfriend.  It was not working out with his current girlfriend for him to live there with which he agreed.  He states today that he is better with his issues at [facility].  He understands that, "I am not going back."  As result he does not identify any anxiety. Today he wants to discuss his career path.  This is where the majority of his anxiety comes in along with some sadness.  "I did a career assessment online."  The client states that the results were comprehensive and he felt that they were spot on for him.  It indicated that he might want to pursue a career as a Sport and exercise psychologist.  This resonates with the client and he has begun to explore some different opportunities.  He already has an undergraduate degree but the local university states he would have to repeat all his general education courses again for degree in computer science.  He checked with [facility] and the [employer].  Both offer software boot camps that last about 9 months.  The cost are similar at around $15,000.  He could also go to [employer] and complete a computer science degree and then possibly get a masters.  The biggest problem that he runs in to is his current debt from his undergraduate degree.  He is hesitant to take on more debt although he sees that upon graduation he could easily get a six-figure income.  He wants his stepfather to be on board since he has been very generous and helpful with his financial situation.  I suggested to the client that he lay out each option with the costs of each with the potential income available.  The client agrees that if his stepfather can see the numbers he [place] be more open to being supportive with him.  I used eye-movement with the client through the course of the session reducing his overall anxiety from a subjective units of distress of 5 to less than 2.  Interventions: Assertiveness/Communication, Motivational Interviewing, Solution-Oriented/Positive Psychology, [employer] Desensitization and Reprocessing (EMDR) and Insight-Oriented  Diagnosis:   ICD-10-CM   1. Major depressive disorder, single episode, mild (HCC)  F32.0     Plan: Mood independent behavior, positive self talk, self-care, spreadsheet with the different options for school and financial outcomes, talk to stepdad,  assertiveness, boundaries.  Jalesia Loudenslager, [facility]

## 2020-10-19 ENCOUNTER — Encounter: Payer: Self-pay | Admitting: Psychiatry

## 2020-10-19 ENCOUNTER — Ambulatory Visit (INDEPENDENT_AMBULATORY_CARE_PROVIDER_SITE_OTHER): Payer: 59 | Admitting: Psychiatry

## 2020-10-19 ENCOUNTER — Ambulatory Visit (INDEPENDENT_AMBULATORY_CARE_PROVIDER_SITE_OTHER): Payer: 59 | Admitting: Sports Medicine

## 2020-10-19 ENCOUNTER — Encounter: Payer: Self-pay | Admitting: Sports Medicine

## 2020-10-19 ENCOUNTER — Other Ambulatory Visit: Payer: Self-pay

## 2020-10-19 VITALS — BP 112/72 | Ht 68.0 in | Wt 145.0 lb

## 2020-10-19 DIAGNOSIS — M67431 Ganglion, right wrist: Secondary | ICD-10-CM

## 2020-10-19 DIAGNOSIS — F320 Major depressive disorder, single episode, mild: Secondary | ICD-10-CM | POA: Diagnosis not present
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
## 2020-10-19 NOTE — Patient Instructions (Signed)
We are going to refer you to a hand surgeon for the ganglion cyst on your right wrist.  Dr. Betha Loa [facility] [address], [place] [address] [phone]  Appt: Friday 10/29/20 @ 9 am

## 2020-10-19 NOTE — Progress Notes (Signed)
      [facility] Counselor/Therapist Progress Note  Patient ID: Christian Delgado, MRN: [id number],    Date: 10/19/2020  Time Spent: 50 minutes   Treatment Type: Individual Therapy  Reported Symptoms: anxious, sad  Mental Status Exam:  Appearance:   Well Groomed     Behavior:  Appropriate  Motor:  Normal  Speech/Language:   Clear and Coherent  Affect:  Appropriate  Mood:  anxious and sad  Thought process:  normal  Thought content:    WNL  Sensory/Perceptual disturbances:    WNL  Orientation:  oriented to person, place, time/date and situation  Attention:  Good  Concentration:  Good  Memory:  WNL  Fund of knowledge:   Good  Insight:    Good  Judgment:   Good  Impulse Control:  Good   Risk Assessment: Danger to Self:  No Self-injurious Behavior: No Danger to Others: No Duty to Warn:no Physical Aggression / Violence:No  Access to Firearms a concern: No  Gang Involvement:No   Subjective: The client states he plans on spending the holiday with family in [place] for Thanksgiving.  His girlfriend will be attending with him.  He notes that the last few days he has had a dip in his mood with anxiety and depression.  He is still thinking about his job at [employer].  The other day he went to the [employer] in [place] and found himself triggered.  Today we used eye-movement focusing on the client's trip to [facility].  His negative cognition is, "I am on guard.  I am not getting approval."  He feels anxiety and depression in his chest.  His subjective units of distress is a 7.  As the client processed he realized that he is desperate for approval.  He knows that his father never gave him that and he felt alone and untethered through high school and college.  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  This wounded the client because he thought he had been doing well.  As we discussed it he agreed that he was doing  well.  He pointed out to his manager that one of the other managers was well known for being arrogant.  "I am nothing like that."  His manager actually agreed with him.  As we continue to process, I pointed out to the client that he has a number of good character traits on board.  He is thoughtful, he is hard-working, and he is very responsible.  I had the client focus on the things that he has been successful with as he use the eye-movement.  I explained that he did not have to justify his behavior or get anyone's approval for who he is.  He agreed.  His positive cognition at the end of the session was, "I am enough."  His subjective units of distress was less than 2.  Interventions: Assertiveness/Communication, Motivational Interviewing, Solution-Oriented/Positive Psychology, [employer] Desensitization and Reprocessing (EMDR) and Insight-Oriented  Diagnosis:   ICD-10-CM   1. Major depressive disorder, single episode, mild (HCC)  F32.0     Plan: Positive self talk, mood independent behavior, self-care, exercise, assertiveness, boundaries.  Gelene Mink Shogo Larkey, [facility]

## 2020-10-26 ENCOUNTER — Other Ambulatory Visit: Payer: Self-pay

## 2020-10-26 ENCOUNTER — Ambulatory Visit (INDEPENDENT_AMBULATORY_CARE_PROVIDER_SITE_OTHER): Payer: 59 | Admitting: Psychiatry

## 2020-10-26 ENCOUNTER — Encounter: Payer: Self-pay | Admitting: Psychiatry

## 2020-10-26 ENCOUNTER — Telehealth: Payer: Self-pay | Admitting: Psychiatry

## 2020-10-26 DIAGNOSIS — F320 Major depressive disorder, single episode, mild: Secondary | ICD-10-CM

## 2020-10-26 NOTE — Telephone Encounter (Signed)
Received an FMLA form on [employer] from [facility]. Have given to [facility] to complete.

## 2020-10-26 NOTE — Progress Notes (Signed)
[facility] Counselor/Therapist Progress Note  Patient ID: Jaaziah Schulke, MRN: [id number],    Date: 10/26/2020  Time Spent: 50 minutes   Treatment Type: Individual Therapy  Reported Symptoms: sad, anxious  Mental Status Exam:  Appearance:   Well Groomed     Behavior:  Appropriate  Motor:  Normal  Speech/Language:   Clear and Coherent  Affect:  Appropriate  Mood:  anxious and sad  Thought process:  normal  Thought content:    WNL  Sensory/Perceptual disturbances:    WNL  Orientation:  oriented to person, place, time/date and situation  Attention:  Good  Concentration:  Good  Memory:  WNL  Fund of knowledge:   Good  Insight:    Good  Judgment:   Good  Impulse Control:  Good   Risk Assessment: Danger to Self:  No Self-injurious Behavior: No Danger to Others: No Duty to Warn:no Physical Aggression / Violence:No  Access to Firearms a concern: No  Gang Involvement:No   Subjective: The client reports that he has been accepted to the [employer].  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  The client is very excited.  He has found that he has been able to channel his anxiety into productivity.  "This has been a good way to regulate my anxiety."  We discussed the other things the client could do to also regulate his anxiety.  I suggested to the client doing cardio 3 days a week for at least 30 to 40 minutes making sure that his heartbeat gets up to 120 bpm.  The client agrees and has been going to the gym daily but has not been doing as much cardio.  He agrees to integrate that.  He also noted that when his sleep is off he can get more depressed.  We discussed having a regular bedtime and wake up time to regulate his sleep cycle.  The client agreed as well.  Recently he had some nightmares where he was being chased by something large.  He states that in the negative  cognitions connected to this are, "I am not good enough.  I will not be good enough.  I will fail."  He feels sadness in his chest.  Hs subjective units of distress is a 9.  As the client processed memories came up around his dad.  He realized that his dad has not been terribly supportive of him.  He notes that his stepmother has been more supportive of him than his dad.  As he looks over the last 14 years he realized how untethered and overlooked he was.  He has had a hard time with his dad in not engaging with him.  When he found out about his acceptance to the state program he called his dad who did not pick up or return his phone call.  As we continued to process the client felt some anger.  He realized that he can use this anger as motivation to complete this program.  He is concerned about not being successful with it.  We discussed that it would take one step at a time.  If he made the effort to study and do what was required he would ultimately be successful.  He agreed.  The client also realized how controlled he was by his past.  As he processed that, he could let it go.  His positive cognition was, "I am  going to succeed."  His subjective units of distress was less than 1.  Interventions: Assertiveness/Communication, Motivational Interviewing, Solution-Oriented/Positive Psychology, [employer] Desensitization and Reprocessing (EMDR) and Insight-Oriented  Diagnosis:   ICD-10-CM   1. Major depressive disorder, single episode, mild (HCC)  F32.0     Plan: Sleep hygiene, mood independent behavior, positive self talk, self-care, cardio and other exercise, assertiveness, boundaries, one step at a time.  Gelene Mink Skylan Lara, [facility]

## 2020-11-01 ENCOUNTER — Encounter: Payer: Self-pay | Admitting: Psychiatry

## 2020-11-01 ENCOUNTER — Other Ambulatory Visit: Payer: Self-pay

## 2020-11-01 ENCOUNTER — Ambulatory Visit (INDEPENDENT_AMBULATORY_CARE_PROVIDER_SITE_OTHER): Payer: 59 | Admitting: Psychiatry

## 2020-11-01 DIAGNOSIS — F320 Major depressive disorder, single episode, mild: Secondary | ICD-10-CM
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
## 2020-11-08 ENCOUNTER — Other Ambulatory Visit: Payer: Self-pay

## 2020-11-08 ENCOUNTER — Encounter: Payer: Self-pay | Admitting: Psychiatry

## 2020-11-08 ENCOUNTER — Ambulatory Visit (INDEPENDENT_AMBULATORY_CARE_PROVIDER_SITE_OTHER): Payer: 59 | Admitting: Psychiatry

## 2020-11-08 DIAGNOSIS — F320 Major depressive disorder, single episode, mild: Secondary | ICD-10-CM | POA: Diagnosis not present

## 2020-11-08 NOTE — Progress Notes (Signed)
      [facility] Counselor/Therapist Progress Note  Patient ID: Talmage Teaster, MRN: [id number],    Date: 11/08/2020  Time Spent: 50 minutes   Treatment Type: Individual Therapy  Reported Symptoms: sad, anxious  Mental Status Exam:  Appearance:   Casual     Behavior:  Appropriate  Motor:  Normal  Speech/Language:   Clear and Coherent  Affect:  Appropriate  Mood:  anxious and sad  Thought process:  normal  Thought content:    WNL  Sensory/Perceptual disturbances:    WNL  Orientation:  oriented to person, place, time/date and situation  Attention:  Good  Concentration:  Good  Memory:  WNL  Fund of knowledge:   Good  Insight:    Good  Judgment:   Good  Impulse Control:  Good   Risk Assessment: Danger to Self:  No Self-injurious Behavior: No Danger to Others: No Duty to Warn:no Physical Aggression / Violence:No  Access to Firearms a concern: No  Gang Involvement:No   Subjective: The client states that he woke up feeling a "dip" today and his mood.  He had increased the Zoloft from 25 to 50 mg.  This past Friday he experienced nausea and diarrhea early in the morning.  He states that he has experienced this in the past with the medication.  I encouraged him to talk to his primary care physician to discuss a possible medication change.  The client has used Wellbutrin in the past to good effect.  He does not currently have a primary care physician.  I referred him to Dr. Lavada Mesi.  The client realized that this doctor had previously been his PCP before Dr. Prince Rome moved to [place].  Dr. Prince Rome returned to [facility] within the last year.  He is staying with the 25 mg until he speaks to a physician.  The client will also pay attention to any dips that he has and be proactive with exercise and other mood independent behaviors that help move his mood to a positive state. Today the client is feeling the stress and pressure that he puts on himself.  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  We discussed that he Teale Goodgame run into people like the manager he had conflict with at [facility] when managing others.  The client agreed.  Today I used eye-movement with the client focusing on possible conflict with others in the future.  His subjective units of distress was at a 4+.  His negative cognition was, "I will be overwhelmed."  He feels anxiety in his chest.  As the client processed he stated that he is learned to have more separation from others.  Others do not have to define who he is.  As the client continued to process this his subjective units of distress went down to 0.  His positive cognition at the end of the session was, "I can be myself."  Interventions: Assertiveness/Communication, Motivational Interviewing, Solution-Oriented/Positive Psychology, [employer] Desensitization and Reprocessing (EMDR) and Insight-Oriented  Diagnosis:   ICD-10-CM   1. Major depressive disorder, single episode, mild (HCC)  F32.0     Plan: Assertiveness, boundaries, positive self talk, self-care, mood independent behavior, exercise.  Gelene Mink Duchess Armendarez, [facility]

## 2020-11-10 ENCOUNTER — Telehealth: Payer: Self-pay | Admitting: Psychiatry

## 2020-11-10 NOTE — Telephone Encounter (Signed)
Received an FMLA form from [employer] company regarding [employer]. Placed in Fred's box. Needs faxed back to [phone].

## 2020-11-17 ENCOUNTER — Other Ambulatory Visit: Payer: Self-pay

## 2020-11-17 ENCOUNTER — Encounter: Payer: Self-pay | Admitting: Psychiatry

## 2020-11-17 ENCOUNTER — Ambulatory Visit (INDEPENDENT_AMBULATORY_CARE_PROVIDER_SITE_OTHER): Payer: 59 | Admitting: Psychiatry

## 2020-11-17 DIAGNOSIS — F320 Major depressive disorder, single episode, mild: Secondary | ICD-10-CM | POA: Diagnosis not present
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
## 2020-11-24 ENCOUNTER — Ambulatory Visit: Payer: 59 | Admitting: Psychiatry

## 2020-12-06 ENCOUNTER — Telehealth: Payer: Self-pay | Admitting: Psychiatry

## 2020-12-06 NOTE — Telephone Encounter (Signed)
Received a medical leave form on Murrell [place] from [employer] leave management services. Placed in [employer].

## 2020-12-07 ENCOUNTER — Ambulatory Visit: Payer: 59 | Admitting: Family Medicine

## 2020-12-07 VITALS — BP 118/71 | HR 69 | Ht 68.0 in | Wt 153.6 lb

## 2020-12-07 DIAGNOSIS — F3342 Major depressive disorder, recurrent, in full remission: Secondary | ICD-10-CM

## 2020-12-07 DIAGNOSIS — F988 Other specified behavioral and emotional disorders with onset usually occurring in childhood and adolescence: Secondary | ICD-10-CM

## 2020-12-07 DIAGNOSIS — F419 Anxiety disorder, unspecified: Secondary | ICD-10-CM

## 2020-12-07 MED ORDER — BUPROPION HCL ER (XL) 300 MG PO TB24: 300.0000 mg | ORAL_TABLET | Freq: Every day | ORAL | 3 refills | Status: AC

## 2020-12-07 NOTE — Patient Instructions (Signed)
    L-Theanine:  200 mg 2-3 times daily as needed for anxiety   Zinc:  20-30 mg daily (closer to 40 mg if already taking copper 1 mg daily)

## 2020-12-07 NOTE — Progress Notes (Signed)
Office Visit Note   Patient: Christian Delgado           Date of Birth: [birth year]           MRN: [id number] Visit Date: 12/07/2020 Requested by: No referring provider defined for this encounter. PCP: Patient, No Pcp Per  Subjective: Chief Complaint  Patient presents with  . Other    Reestablish primary care - med adjustment    HPI: He is here to reestablish care.  He has been working with Merlyn Albert May to manage depression.  He is doing well and is currently on bupropion 75 mg twice daily.  He would like to switch to an extended release version since that is typically more beneficial for anxiety and ADHD symptoms.  He has taken Wellbutrin XL in the past with no side effects.  For anxiety he has found that taking ibuprofen 600 mg makes a big difference for him.  In the past he took hydroxyzine but it made him very drowsy.  He wanted to be sure it was safe to take ibuprofen about once every 10 days.  Over the Christmas break he and his girlfriend were sick with COVID.  He had symptoms for about 10 to 14 days.  He is better now, but when he tried to return to the gym he was very tired the next day.  He wanted to be sure it was okay to resume workouts.  He did slightly lose his sense of smell during the illness but that seems to have returned to normal.  He had labs drawn recently elsewhere and is waiting for those results.  He is currently studying to get a Lobbyist degree.                ROS:   All other systems were reviewed and are negative.  Objective: Vital Signs: BP 118/71   Pulse 69   Ht 5\' 8"  (1.727 m)   Wt 153 lb 9.6 oz (69.7 kg)   BMI 23.35 kg/m   Physical Exam:  General:  Alert and oriented, in no acute distress. Pulm:  Breathing unlabored. Psy:  Normal mood, congruent affect. Skin: No rash HEENT: No significant lymphadenopathy.  No thyromegaly or nodules.  2+ carotid pulses without bruits. CV: Regular rate and rhythm without murmurs, rubs, or gallops.    Lungs: Clear to auscultation throughout with no wheezing or areas of consolidation. Abd: Bowel sounds are active, no hepatosplenomegaly or masses.  Soft and nontender.  No audible bruits.  No evidence of ascites. Extremities: He has leukonychia of 2 of his fingernails.    Imaging: No results found.  Assessment & Plan: 1. anxiety and depression, doing well. - Wellbutrin XL 300 mg daily.  Return in 6 to 12 months for recheck, sooner for any problems. -Ibuprofen as needed for anxiety.  Could add Theanine if needed.  2.  Post-covid tiredness after workout - Gradually ease into workouts again. - Add zinc 20-30 mg daily.       Procedures: No procedures performed        PMFS History: Patient Active Problem List   Diagnosis Date Noted  . Right wrist pain 05/27/2018   No past medical history on file.  No family history on file.  No past surgical history on file. Social History   Occupational History  . Not on file  Tobacco Use  . Smoking status: Never Smoker  . Smokeless tobacco: Never Used  Substance and Sexual Activity  . Alcohol  use: Yes  . Drug use: Never  . Sexual activity: Not on file

## 2020-12-09 ENCOUNTER — Ambulatory Visit (INDEPENDENT_AMBULATORY_CARE_PROVIDER_SITE_OTHER): Payer: Self-pay | Admitting: Psychiatry

## 2020-12-09 ENCOUNTER — Other Ambulatory Visit: Payer: Self-pay

## 2020-12-09 ENCOUNTER — Encounter: Payer: Self-pay | Admitting: Psychiatry

## 2020-12-09 DIAGNOSIS — F320 Major depressive disorder, single episode, mild: Secondary | ICD-10-CM

## 2020-12-09 NOTE — Progress Notes (Signed)
[facility] Counselor/Therapist Progress Note  Patient ID: Christian Delgado, MRN: [id number],    Date: 12/09/2020  Time Spent: 50 minutes   Treatment Type: Individual Therapy  Reported Symptoms: anxiety, sad  Mental Status Exam:  Appearance:   Casual     Behavior:  Appropriate  Motor:  Normal  Speech/Language:   Clear and Coherent  Affect:  Appropriate  Mood:  anxious and sad  Thought process:  normal  Thought content:    WNL  Sensory/Perceptual disturbances:    WNL  Orientation:  oriented to person, place, time/date and situation  Attention:  Good  Concentration:  Good  Memory:  WNL  Fund of knowledge:   Good  Insight:    Good  Judgment:   Good  Impulse Control:  Good   Risk Assessment: Danger to Self:  No Self-injurious Behavior: No Danger to Others: No Duty to Warn:no Physical Aggression / Violence:No  Access to Firearms a concern: No  Gang Involvement:No   Subjective: "I got COVID over Christmas.  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  He has found it difficult to manage the stress.  Exercise has been more complicated since he feels his lungs were compromised due to the COVID-19 infection. The client states that he cannot remember the nature of his nightmares.  Some of them relate to his past long-term girlfriend who stepped out on him at the 36-month mark.  His current girlfriend he has been dating for 8 months.  There is also the stress with starting a new program.  The cost is significant.  He is going to have to apply for [employer].  This disappoints him because his stepfather believes he should always cash flow everything.  Today I used eye movement and the bilateral stimulation handpaddles with the client to process the anxiety connected to this.  His negative cognition is, "I fear the unknown."  He feels anxiety in his  chest.  It is a subjective units of distress of 6+.  As the client processed he stated that he is meeting with a student advisor to go through the steps for the [employer] on Friday.  He feels shame at having to take out a loan.  He knows his stepfather will disapprove.  I pointed out to the client that he is 35 years old.  He has every right to make the best decisions he can financially.  I explained that this is an investment in his future.  He agreed.  Using the bilateral stimulation hand paddles the client stated that his spiritual life plays into all of this.  He agrees that daily prayer has been helpful.  He has this fear of failing.  I pointed out to the client that he has overcome a lot of obstacles in his 56s.  He agreed.  The client visualized himself working at his computer and invited Jesus into the picture.  He stated he felt peace and encouragement.  His positive cognition is, "I am his."  His subjective units of distress was at 2.  Interventions: Assertiveness/Communication, Motivational Interviewing, Solution-Oriented/Positive Psychology, [employer] Desensitization and Reprocessing (EMDR) and Insight-Oriented  Diagnosis:   ICD-10-CM   1. Major depressive disorder, single episode, mild (HCC)  F32.0     Plan: Mindful prayer, mood independent behavior, exercise, positive self talk, self-care, radical acceptance, assertiveness, boundaries.  Athenia Rys, [facility]

## 2020-12-13 ENCOUNTER — Ambulatory Visit: Payer: Self-pay | Admitting: Psychiatry

## 2020-12-14 ENCOUNTER — Ambulatory Visit: Payer: Self-pay | Admitting: Psychiatry

## 2020-12-16 ENCOUNTER — Telehealth: Payer: Self-pay | Admitting: Psychiatry

## 2020-12-16 NOTE — Telephone Encounter (Signed)
Hi Vernona Rieger, just wanted to let you know since the patient doesn't see a provider that prescribes medication the forms would go to the counselor they see. The forms were placed in Christian Delgado's box

## 2020-12-16 NOTE — Telephone Encounter (Signed)
Received fax from [facility] regarding [employer]. Requesting completion of medical certification. Placed on Traci's desk.

## 2020-12-28 ENCOUNTER — Ambulatory Visit (INDEPENDENT_AMBULATORY_CARE_PROVIDER_SITE_OTHER): Payer: Self-pay | Admitting: Psychiatry

## 2020-12-28 ENCOUNTER — Encounter: Payer: Self-pay | Admitting: Psychiatry

## 2020-12-28 ENCOUNTER — Other Ambulatory Visit: Payer: Self-pay

## 2020-12-28 DIAGNOSIS — F320 Major depressive disorder, single episode, mild: Secondary | ICD-10-CM

## 2020-12-28 NOTE — Progress Notes (Signed)
      [facility] Counselor/Therapist Progress Note  Patient ID: Christian Delgado, MRN: [id number],    Date: 12/28/2020  Time Spent: 50 minutes   Treatment Type: Individual Therapy  Reported Symptoms: anger, irritable  Mental Status Exam:  Appearance:   Well Groomed     Behavior:  Appropriate  Motor:  Normal  Speech/Language:   Clear and Coherent  Affect:  Appropriate  Mood:  angry and irritable  Thought process:  normal  Thought content:    WNL  Sensory/Perceptual disturbances:    WNL  Orientation:  oriented to person, place, time/date and situation  Attention:  Good  Concentration:  Good  Memory:  WNL  Fund of knowledge:   Good  Insight:    Good  Judgment:   Good  Impulse Control:  Good   Risk Assessment: Danger to Self:  No Self-injurious Behavior: No Danger to Others: No Duty to Warn:no Physical Aggression / Violence:No  Access to Firearms a concern: No  Gang Involvement:No   Subjective: The client reports that he had his first assessment in his computer [employer].  He did not pass.  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  He states that he still gets a redo tomorrow.  He is very angry and upset that he did not pass that the first time around.  He is worried about not being able to stay in the program.  He has contacted one of the professors for the course to get clarification on some of the things he missed.  As the client discussed this is negative cognition was, "how am I going to do this?"  He feels impatience, irritability and anger in his chest.  As the client continue to process this using eye-movement I pointed out that the client's impatient really does him a disservice.  I suggested that at least this afternoon that he get in 30 minutes of cardio to help reduce his overall stress so he can focus and concentrate for his coursework.  He agreed. He stated that his  dad has written him a check for $10,000 to pay towards school this is a big change in his dad's attitude towards his schoolwork.  He was very grateful for the money.  As the client continue to process his subjective units of distress went from a 6 to less than 2.  His positive cognition was, "I can do this."  He will focus on being skillful as he can contacting all the resources that he has access to to solve the problem so he can redo his assessment.  Interventions: Motivational Interviewing, Solution-Oriented/Positive Psychology, [employer] Desensitization and Reprocessing (EMDR) and Insight-Oriented  Diagnosis:   ICD-10-CM   1. Major depressive disorder, single episode, mild (HCC)  F32.0     Plan: Cardio, mood independent behavior, contact other resources, positive self talk, self-care, persistence, assertiveness, boundaries.  Gelene Mink Francile Woolford, [facility]

## 2021-01-04 ENCOUNTER — Ambulatory Visit (INDEPENDENT_AMBULATORY_CARE_PROVIDER_SITE_OTHER): Payer: Self-pay | Admitting: Psychiatry

## 2021-01-04 ENCOUNTER — Other Ambulatory Visit: Payer: Self-pay

## 2021-01-04 ENCOUNTER — Encounter: Payer: Self-pay | Admitting: Psychiatry

## 2021-01-04 DIAGNOSIS — F320 Major depressive disorder, single episode, mild: Secondary | ICD-10-CM
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
## 2021-01-11 ENCOUNTER — Ambulatory Visit: Payer: 59 | Admitting: Psychiatry

## 2021-01-19 ENCOUNTER — Other Ambulatory Visit: Payer: Self-pay

## 2021-01-19 ENCOUNTER — Encounter: Payer: Self-pay | Admitting: Psychiatry

## 2021-01-19 ENCOUNTER — Ambulatory Visit (INDEPENDENT_AMBULATORY_CARE_PROVIDER_SITE_OTHER): Payer: Self-pay | Admitting: Psychiatry

## 2021-01-19 DIAGNOSIS — F320 Major depressive disorder, single episode, mild: Secondary | ICD-10-CM
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
## 2021-02-17 ENCOUNTER — Encounter: Payer: Self-pay | Admitting: Psychiatry

## 2021-02-17 ENCOUNTER — Other Ambulatory Visit: Payer: Self-pay

## 2021-02-17 ENCOUNTER — Ambulatory Visit (INDEPENDENT_AMBULATORY_CARE_PROVIDER_SITE_OTHER): Payer: Self-pay | Admitting: Psychiatry

## 2021-02-17 DIAGNOSIS — F320 Major depressive disorder, single episode, mild: Secondary | ICD-10-CM
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
## 2021-02-24 ENCOUNTER — Other Ambulatory Visit: Payer: Self-pay

## 2021-02-24 ENCOUNTER — Encounter: Payer: Self-pay | Admitting: Psychiatry

## 2021-02-24 ENCOUNTER — Ambulatory Visit (INDEPENDENT_AMBULATORY_CARE_PROVIDER_SITE_OTHER): Payer: Self-pay | Admitting: Psychiatry

## 2021-02-24 DIAGNOSIS — F320 Major depressive disorder, single episode, mild: Secondary | ICD-10-CM
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
## 2021-02-28 ENCOUNTER — Ambulatory Visit (INDEPENDENT_AMBULATORY_CARE_PROVIDER_SITE_OTHER): Payer: Self-pay | Admitting: Psychiatry

## 2021-02-28 ENCOUNTER — Other Ambulatory Visit: Payer: Self-pay

## 2021-02-28 ENCOUNTER — Encounter: Payer: Self-pay | Admitting: Psychiatry

## 2021-02-28 DIAGNOSIS — F320 Major depressive disorder, single episode, mild: Secondary | ICD-10-CM

## 2021-02-28 NOTE — Progress Notes (Signed)
      [facility] Counselor/Therapist Progress Note  Patient ID: Ivery Nanney, MRN: [id number],    Date: 02/28/2021  Time Spent: 50 minutes   Treatment Type: Individual Therapy  Reported Symptoms: anger, anxiety, sadness  Mental Status Exam:  Appearance:   Casual and Well Groomed     Behavior:  Appropriate  Motor:  Normal  Speech/Language:   Clear and Coherent  Affect:  Appropriate  Mood:  angry, anxious and sad  Thought process:  normal  Thought content:    WNL  Sensory/Perceptual disturbances:    WNL  Orientation:  oriented to person, place, time/date and situation  Attention:  Good  Concentration:  Good  Memory:  WNL  Fund of knowledge:   Good  Insight:    Good  Judgment:   Good  Impulse Control:  Good   Risk Assessment: Danger to Self:  No Self-injurious Behavior: No Danger to Others: No Duty to Warn:no Physical Aggression / Violence:No  Access to Firearms a concern: No  Gang Involvement:No   Subjective: The client states that his phone call with the disability insurance company went well.  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  He contrasted working at [employer] which has about 500 people in it versus the [employer] which was around 50.  He was told by the insurance company that they had received a phone call from a [employer] employee that the client was committing fraud.  "This upset me and set me back."  The client had been talking [employer] and had been honest with them.  They said they did understand. Today the client wanted to work on forgiveness.  We focused on a specific employee at [employer] that the client is sure who called, using eye-movement.  His negative cognition is, "I am angry."  He feels anger in his hands.  His subjective units of distress is a 5.  As the client processed he reflected back to his Bible study this week which was about forgiveness.  He knows that vengeance is not his but God's and he  needs to leave room for that.  We discussed not letting this employee define or control who he is.  As we discussed forgiveness and the fact that other peoples anger many times comes out of their own wounds.  The client understood this and agreed.  He realizes that he needs to work towards forgiveness.  His positive cognition at the end of the session was, "it is not about me."  His subjective units of distress was 0.  Interventions: Assertiveness/Communication, Motivational Interviewing, Solution-Oriented/Positive Psychology, [employer] Desensitization and Reprocessing (EMDR) and Insight-Oriented  Diagnosis:   ICD-10-CM   1. Major depressive disorder, single episode, mild (HCC)  F32.0     Plan: Mood independent behavior, positive self talk, self-care, forgiveness, assertiveness, boundaries, transparency.  Gelene Mink Sujey Gundry, [facility]

## 2021-03-08 ENCOUNTER — Other Ambulatory Visit: Payer: Self-pay

## 2021-03-08 ENCOUNTER — Ambulatory Visit (INDEPENDENT_AMBULATORY_CARE_PROVIDER_SITE_OTHER): Payer: Self-pay | Admitting: Psychiatry

## 2021-03-08 ENCOUNTER — Encounter: Payer: Self-pay | Admitting: Psychiatry

## 2021-03-08 DIAGNOSIS — F320 Major depressive disorder, single episode, mild: Secondary | ICD-10-CM
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
## 2021-03-18 ENCOUNTER — Encounter: Payer: Self-pay | Admitting: Psychiatry

## 2021-03-18 ENCOUNTER — Ambulatory Visit (INDEPENDENT_AMBULATORY_CARE_PROVIDER_SITE_OTHER): Payer: Self-pay | Admitting: Psychiatry

## 2021-03-18 ENCOUNTER — Other Ambulatory Visit: Payer: Self-pay

## 2021-03-18 DIAGNOSIS — F320 Major depressive disorder, single episode, mild: Secondary | ICD-10-CM

## 2021-03-18 NOTE — Progress Notes (Signed)
      [facility] Counselor/Therapist Progress Note  Patient ID: Christian Delgado, MRN: [id number],    Date: 03/18/2021  Time Spent: 45 minutes   Treatment Type: Individual Therapy  Reported Symptoms: sad  Mental Status Exam:  Appearance:   Casual and Well Groomed     Behavior:  Appropriate  Motor:  Normal  Speech/Language:   Clear and Coherent  Affect:  Appropriate  Mood:  sad  Thought process:  normal  Thought content:    WNL  Sensory/Perceptual disturbances:    WNL  Orientation:  oriented to person, place, time/date and situation  Attention:  Good  Concentration:  Good  Memory:  WNL  Fund of knowledge:   Good  Insight:    Good  Judgment:   Good  Impulse Control:  Good   Risk Assessment: Danger to Self:  No Self-injurious Behavior: No Danger to Others: No Duty to Warn:no Physical Aggression / Violence:No  Access to Firearms a concern: No  Gang Involvement:No   Subjective: The client states that he is learning new program languages that are not included in his current course of study 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  He states that he and his girlfriend were unable to attend Easter because his girlfriend's mom came down with COVID-19.  The client was sad that they were not able to attend but will try to see if Mother's Day will work.  The client reviewed his work that he has done here.  He feels he is in a much better place emotionally since things have been resolved with [employer].  He sees that whole episode as being fundamental to the changes that he has been able to make as of today.  He feels that spiritually he has grown in a significant way.  He and his girlfriend attend church together and have found great peace and satisfaction in that community. The client has always worried about finances.  He recently sold his motorcycle which gave him the right amount of money to pay off his school loan.  His professors at [employer] have been very supportive and encouraging of his software skills.  They believe he will easily become employed and probably have a starting salary close to $200,000 a year.  The client feels that he is in a very good place right now and has decided to take a break from pursuing any counseling for the moment.  He will be losing his insurance in one month and would not be able to pay out of pocket for someone.  He knows the skills that he has learned here and can use those to keep himself on the right track.  Services ended by mutual consent.  Interventions: Assertiveness/Communication, Motivational Interviewing, Solution-Oriented/Positive Psychology and Insight-Oriented  Diagnosis:   ICD-10-CM   1. Major depressive disorder, single episode, mild (HCC)  F32.0     Plan: Mood independent behavior, positive self talk, self-care, assertiveness, boundaries, exercise.  Gelene Mink Armistead Sult, [facility]

## 2021-03-28 ENCOUNTER — Ambulatory Visit: Payer: Self-pay | Admitting: Psychiatry

## 2022-03-22 ENCOUNTER — Ambulatory Visit: Payer: Managed Care, Other (non HMO) | Admitting: Podiatry

## 2022-03-22 ENCOUNTER — Encounter: Payer: Self-pay | Admitting: Podiatry

## 2022-03-22 DIAGNOSIS — L600 Ingrowing nail: Secondary | ICD-10-CM

## 2022-03-22 NOTE — Patient Instructions (Signed)

## 2022-03-22 NOTE — Progress Notes (Signed)
?  Subjective:  ?Patient ID: Christian Delgado, male    DOB: 05/17/1986,   MRN: [id number] ? ?Chief Complaint  ?Patient presents with  ? Ingrown Toenail  ?  L ingrown toe nail, great toe  ? ? ?36 y.o. male presents for concern of left ingrown toenail that has been going on for a while. He relates he has tried trimming back but relates it comes back and every now and then will flare up. Currently no redness drainage or swelling.  . Denies any other pedal complaints. Denies n/v/f/c.  ? ?History reviewed. No pertinent past medical history. ? ?Objective:  ?Physical Exam: ?Vascular: DP/PT pulses 2/4 bilateral. CFT <3 seconds. Normal hair growth on digits. No edema.  ?Skin. No lacerations or abrasions bilateral feet. Left hallux medial border with incurvation. No erythema edema or purulence noted.  ?Musculoskeletal: MMT 5/5 bilateral lower extremities in DF, PF, Inversion and Eversion. Deceased ROM in DF of ankle joint.  ?Neurological: Sensation intact to light touch.  ? ?Assessment:  ? ?1. Ingrown nail of great toe of left foot   ? ? ? ?Plan:  ?Patient was evaluated and treated and all questions answered. ?Patient requesting removal of ingrown nail today. Procedure below.  ?Discussed procedure and post procedure care and patient expressed understanding.  ?Will follow-up in 2 weeks for nail check or sooner if any problems arise.  ? ? ?Procedure:  ?Procedure: partial Nail Avulsion of left hallux medial nail border.  ?Surgeon: Louann Sjogren, DPM  ?Pre-op Dx: Ingrown toenail without infection ?Post-op: Same  ?Place of Surgery: Office exam room.  ?Indications for surgery: Painful and ingrown toenail.  ? ? ?The patient is requesting removal of nail with chemical matrixectomy. Risks and complications were discussed with the patient for which they understand and written consent was obtained. Under sterile conditions a total of 3 mL of  1% lidocaine plain was infiltrated in a hallux block fashion. Once anesthetized, the skin was  prepped in sterile fashion. A tourniquet was then applied. Next the medial aspect of hallux nail border was then sharply excised making sure to remove the entire offending nail border.  Next phenol was then applied under standard conditions and copiously irrigated. Silvadene was applied. A dry sterile dressing was applied. After application of the dressing the tourniquet was removed and there is found to be an immediate capillary refill time to the digit. The patient tolerated the procedure well without any complications. Post procedure instructions were discussed the patient for which he verbally understood. Follow-up in two weeks for nail check or sooner if any problems are to arise. Discussed signs/symptoms of infection and directed to call the office immediately should any occur or go directly to the emergency room. In the meantime, encouraged to call the office with any questions, concerns, changes symptoms. ? ? ?Louann Sjogren, DPM  ? ? ?

## 2022-06-20 ENCOUNTER — Ambulatory Visit: Payer: Managed Care, Other (non HMO) | Admitting: Podiatry

## 2022-06-20 ENCOUNTER — Encounter: Payer: Self-pay | Admitting: Podiatry

## 2022-06-20 DIAGNOSIS — L600 Ingrowing nail: Secondary | ICD-10-CM | POA: Diagnosis not present

## 2022-06-20 NOTE — Patient Instructions (Addendum)

## 2022-06-20 NOTE — Progress Notes (Signed)
  Subjective:  Patient ID: Christian Delgado, male    DOB: 01-Jun-1986,   MRN: [id number]  Chief Complaint  Patient presents with   Ingrown Toenail    Patient states no pain at the moment  and no drainage.      36 y.o. male presents for follow-up of left ingrown nail and some questions about that side. No pain or any issues but did have some extra nail growing out that he trimmed. Also requesting to have right ingrown nail looked and and possible procedure today.  . Denies any other pedal complaints. Denies n/v/f/c.   History reviewed. No pertinent past medical history.  Objective:  Physical Exam: Vascular: DP/PT pulses 2/4 bilateral. CFT <3 seconds. Normal hair growth on digits. No edema.  Skin. No lacerations or abrasions bilateral feet. Left hallux healed well no concern. Right medial border hallux nail incurvated. No erythema edema or purulence noted.  Musculoskeletal: MMT 5/5 bilateral lower extremities in DF, PF, Inversion and Eversion. Deceased ROM in DF of ankle joint.  Neurological: Sensation intact to light touch.   Assessment:   1. Ingrown nail of great toe of right foot      Plan:  Patient was evaluated and treated and all questions answered. Patient requesting removal of ingrown nail today. Procedure below.  Discussed procedure and post procedure care and patient expressed understanding.  Will follow-up in 2 weeks for nail check or sooner if any problems arise.    Procedure:  Procedure: partial Nail Avulsion of right hallux medial nail border.  Surgeon: Louann Sjogren, DPM  Pre-op Dx: Ingrown toenail without infection Post-op: Same  Place of Surgery: Office exam room.  Indications for surgery: Painful and ingrown toenail.    The patient is requesting removal of nail with chemical matrixectomy. Risks and complications were discussed with the patient for which they understand and written consent was obtained. Under sterile conditions a total of 3 mL of  1%  lidocaine plain was infiltrated in a hallux block fashion. Once anesthetized, the skin was prepped in sterile fashion. A tourniquet was then applied. Next the medial aspect of hallux nail border was then sharply excised making sure to remove the entire offending nail border.  Next phenol was then applied under standard conditions and copiously irrigated. Silvadene was applied. A dry sterile dressing was applied. After application of the dressing the tourniquet was removed and there is found to be an immediate capillary refill time to the digit. The patient tolerated the procedure well without any complications. Post procedure instructions were discussed the patient for which he verbally understood. Follow-up in two weeks for nail check or sooner if any problems are to arise. Discussed signs/symptoms of infection and directed to call the office immediately should any occur or go directly to the emergency room. In the meantime, encouraged to call the office with any questions, concerns, changes symptoms.   Louann Sjogren, DPM

## 2022-08-16 ENCOUNTER — Other Ambulatory Visit: Payer: Self-pay | Admitting: Podiatry

## 2022-08-16 ENCOUNTER — Encounter: Payer: Self-pay | Admitting: Podiatry

## 2022-08-16 ENCOUNTER — Telehealth: Payer: Self-pay | Admitting: Podiatry

## 2022-08-16 MED ORDER — CEPHALEXIN 500 MG PO CAPS: 500.0000 mg | ORAL_CAPSULE | Freq: Four times a day (QID) | ORAL | 0 refills | Status: AC

## 2022-08-16 NOTE — Telephone Encounter (Signed)
Pt stated his L foot great toe is now infected . Pt is uploading on mychart for you to see a picture to see what he should do.  Please advise

## 2022-08-16 NOTE — Telephone Encounter (Signed)
Hi Christian Delgado,   I will send in an antibiotics to be on the save side. It looks like it could be some mild infection. I would like for you to do some epsom salt soaks daily and keep neosporin and a bandaid on there for the next week or so. If not doing better in the next 3-5 days come in to see [place]. Hopefully this does well. Have a great day!   Dr. Blenda Mounts

## 2022-09-07 ENCOUNTER — Ambulatory Visit: Payer: Managed Care, Other (non HMO) | Admitting: Podiatrist

## 2022-09-07 ENCOUNTER — Encounter: Payer: Self-pay | Admitting: Podiatrist

## 2022-09-07 DIAGNOSIS — L03032 Cellulitis of left toe: Secondary | ICD-10-CM | POA: Diagnosis not present

## 2022-09-07 MED ORDER — AMOXICILLIN-POT CLAVULANATE 875-125 MG PO TABS: 1.0000 | ORAL_TABLET | Freq: Two times a day (BID) | ORAL | 0 refills | Status: AC

## 2022-09-07 MED ORDER — MUPIROCIN 2 % EX OINT: 1.0000 | TOPICAL_OINTMENT | Freq: Two times a day (BID) | CUTANEOUS | 2 refills | Status: AC

## 2022-09-07 NOTE — Progress Notes (Signed)
Chief Complaint  Patient presents with   Ingrown Toenail    Possible infected Bilateral ingrown      HPI: Patient is 36 y.o. male who presents today for follow up of ingrown toenails medial border left and right.  Left hallux medial border is inflammed and red.  He has pain in the corners of both of these nails.  He is getting married in 2 days and would like to avoid a procedure today if possible.  He relates he is having trouble wearing enclosed shoes due to the discomfort in these corners.    No Known Allergies  Review of systems is negative except as noted in the HPI.  Denies nausea/ vomiting/ fevers/ chills or night sweats.   Denies difficulty breathing, denies calf pain or tenderness  Physical Exam  Patient is awake, alert, and oriented x 3.  In no acute distress.    Vascular status is intact with palpable pedal pulses DP and PT bilateral and capillary refill time less than 3 seconds bilateral.  No edema or erythema noted.   Neurological exam reveals epicritic and protective sensation grossly intact bilateral.   Dermatological exam reveals skin is supple and dry to bilateral feet.  Medial border of the left hallux nail is red and inflammed.  No active pus or drainage noted. Pain with palpation on the nail fold is noted.  Left hallux nail also has pain on the medial corner as well.  No inflammation noted.  Musculoskeletal exam: Musculature intact with dorsiflexion, plantarflexion, inversion, eversion. Ankle and First MPJ joint range of motion normal.     Assessment:   ICD-10-CM   1. Paronychia of great toe of left foot  L03.032        Plan: Discussed exam findings and treatment options.  Today's visit we discussed oral antibiotics and return in 10 days to 2 weeks and if not better we can perform a procedure to remove more of the medial nail to allow a channel for drainage.  He would like to try the oral antibiotics first.  Prescription for Augmentin was written he will be seen  back in 10 to 14 days for follow-up and likely procedure on the medial nail border bilateral hallux nails.  If any problems or concerns arise in the meantime he will call.

## 2022-09-22 ENCOUNTER — Ambulatory Visit: Payer: Managed Care, Other (non HMO) | Admitting: Podiatrist

## 2022-09-28 ENCOUNTER — Ambulatory Visit: Payer: Managed Care, Other (non HMO) | Admitting: Podiatrist

## 2022-09-28 ENCOUNTER — Encounter: Payer: Self-pay | Admitting: Podiatrist

## 2022-09-28 DIAGNOSIS — L600 Ingrowing nail: Secondary | ICD-10-CM

## 2022-09-28 NOTE — Patient Instructions (Addendum)
Place 1/4 cup of epsom salts in a quart of warm tap water.  Submerge your foot or feet in the solution and soak for 20 minutes.  This soak should be done twice a day.  Next, remove your foot or feet from solution, blot dry the affected area. Apply ointment and cover if instructed by your doctor.    Do the soaks for about 5-7 days.  Apply mupirocin ointment and a bandaid for this time-  after a week ok to leave open to air at night and cover during the day.  Keep covered during the day until no yellowish circle is on the bandaid.    IF YOUR SKIN BECOMES IRRITATED WHILE USING THESE INSTRUCTIONS, IT IS OKAY TO SWITCH TO   antibacterial soap and water.  Monitor for any signs/symptoms of infection. Call the office immediately if any occur or go directly to the emergency room. Call with any questions/concerns.

## 2022-09-28 NOTE — Progress Notes (Signed)
Chief Complaint  Patient presents with   Nail Problem     Recheck of ingrown toenail     HPI: Patient is 36 y.o. male who presents today for follow up of ingrown nails medial sides of left and right great toenails.  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  Some tenderness present medial border right.  He has finished the augmentin presecribed at the last visit.    No Known Allergies  Review of systems is negative except as noted in the HPI.  Denies nausea/ vomiting/ fevers/ chills or night sweats.   Denies difficulty breathing, denies calf pain or tenderness  Physical Exam  Patient is awake, alert, and oriented x 3.  In no acute distress.    Vascular status is intact with palpable pedal pulses DP and PT bilateral and capillary refill time less than 3 seconds bilateral.  No edema or erythema noted.   Neurological exam reveals epicritic and protective sensation grossly intact bilateral.   Dermatological exam reveals skin is supple and dry to bilateral feet.  Left hallux medial border still has some inflammation present-  slight redness noted, no swelling or active drainage noted.  Right great toe medial border appears to be healed.  Still some residual tenderness on the medial nail border but overall improved.   Musculoskeletal exam: Musculature intact with dorsiflexion, plantarflexion, inversion, eversion. Ankle and First MPJ joint range of motion normal.     Assessment:   ICD-10-CM   1. Ingrown nail of great toe of left foot  L60.0        Plan: Treatment options and alternatives were discussed. Recommended a re do- of a permanent removal of the medial nail border of the left great toenail. Patient agreed. Skin was prepped with alcohol and a local injection of lidocaine and Marcaine plain was infiltrated to anesthetize the toe. The toe was then prepped with Betadine and exsanguinated. The offending nail  border was removed and phenol applied to the exposed matrix tissue.  The area was then cleansed well with alcohol.  Antibiotic ointment and a dressing was then applied the tourniquet released  noting a prompt hyperemic response to the tip of the toe.  Oral and written instructions were dispensed and the patient was instructed on aftercare.  If there is any increased redness, swelling, drainage, pus or any other concerns arise, Vernie will call to be seen.

## 2024-02-13 ENCOUNTER — Other Ambulatory Visit: Payer: Self-pay | Admitting: *Deleted

## 2024-02-13 ENCOUNTER — Telehealth (INDEPENDENT_AMBULATORY_CARE_PROVIDER_SITE_OTHER): Payer: Self-pay | Admitting: Otolaryngology

## 2024-02-13 DIAGNOSIS — R519 Headache, unspecified: Secondary | ICD-10-CM

## 2024-02-13 NOTE — Telephone Encounter (Signed)
 LVM to confirm appt & location [phone] afm

## 2024-02-14 ENCOUNTER — Institutional Professional Consult (permissible substitution) (INDEPENDENT_AMBULATORY_CARE_PROVIDER_SITE_OTHER): Payer: Self-pay

## 2024-02-18 ENCOUNTER — Other Ambulatory Visit: Payer: Self-pay | Admitting: *Deleted

## 2024-02-18 DIAGNOSIS — R519 Headache, unspecified: Secondary | ICD-10-CM

## 2024-02-18 DIAGNOSIS — H93A9 Pulsatile tinnitus, unspecified ear: Secondary | ICD-10-CM

## 2024-03-06 ENCOUNTER — Encounter (INDEPENDENT_AMBULATORY_CARE_PROVIDER_SITE_OTHER): Payer: Self-pay

## 2024-03-06 ENCOUNTER — Ambulatory Visit (INDEPENDENT_AMBULATORY_CARE_PROVIDER_SITE_OTHER): Payer: Self-pay | Admitting: Otolaryngology

## 2024-03-06 VITALS — BP 126/81 | HR 65 | Ht 68.0 in | Wt 150.0 lb

## 2024-03-06 DIAGNOSIS — J392 Other diseases of pharynx: Secondary | ICD-10-CM

## 2024-03-06 DIAGNOSIS — H6992 Unspecified Eustachian tube disorder, left ear: Secondary | ICD-10-CM

## 2024-03-06 DIAGNOSIS — H938X2 Other specified disorders of left ear: Secondary | ICD-10-CM

## 2024-03-06 MED ORDER — AZELASTINE HCL 0.1 % NA SOLN: 2.0000 | Freq: Two times a day (BID) | NASAL | 12 refills | Status: AC

## 2024-03-06 NOTE — Progress Notes (Signed)
 Dear Dr. Char Common, Here is my assessment for our mutual patient, Christian Delgado. Thank you for allowing me the opportunity to care for your patient. Please do not hesitate to contact me should you have any other questions. Sincerely, Dr. Milon Aloe  Otolaryngology Clinic Note Referring provider: Dr. Char Common HPI:  Christian Delgado is a 38 y.o. male kindly referred by Dr. Char Common for evaluation of nasopharynx lesion and eustachian tube dysfunction symptoms.  Initial visit (02/2024):Christian Delgado  Patient reports: this past fall, felt like it was seasonal sinus issues (congestion) but then had left ear pain and fullness and some ear pressure. No antecedent URI sx. Tried eval for TMJ but did not see much benefit there. Every so often will have some popping/crackling but he does feel like the ear feels full. Pain is better; additional sx include b/l high pitched non-pulsatile tinnitus; hearing does not seem muffled (some in December but not currently). Does have some tingling/sensitivity around ear canal No smoke/chew tobacco (pipe intermittently but quit). No neck masses, no weight loss, night sweats/fevers.  No neck pain, no bruxism but sometimes clenches. No epistaxis, CRS sx, facial numbness, headaches, light or sound sensitivity Did use qtips prior He has tried pred and nasal sprays for this as well  He also has seem other non-otologic symptoms: brain fog; maybe some gait issues on left  Patient denies: vertigo, drainage Patient also denies barotrauma, vestibular suppressant use, ototoxic medication use Prior ear surgery: no  He has seen Christian Delgado for this in the past (Feb 2025) who suspected OE from qtip use. He reports that it did not help much and his sx recurred after drops  Prior eval also included MRI C-spine and MRI Brain  H&N Surgery: denies Personal or FHx of bleeding dz or anesthesia difficulty: no  AP/AC: no  Tobacco: quit  PMHx: reports healthy  Independent Review of Additional Tests or  Records:  Christian Delgado notes (11/26/2023): UC visit - ear pressure left for month, given prednisone, did not help; on nasal sprays - flonase, sudafed; Dx: ETD, otitis media? Rx: Pred, and cortisporin drops Christian Delgado notes (12/2023) [facility]: noted likely OE from qtip use; f/u as needed MRI C-spine (02/27/2024):  MRI Brain (02/27/2024):   PMH/Meds/All/SocHx/FamHx/ROS:  History reviewed. No pertinent past medical history.   History reviewed. No pertinent surgical history.  History reviewed. No pertinent family history.   Social Connections: Not on file      Current Outpatient Medications:    azelastine  (ASTELIN ) 0.1 % nasal spray, Place 2 sprays into both nostrils 2 (two) times daily. Use in each nostril as directed, Disp: 30 mL, Rfl: 12   fluticasone (FLONASE) 50 MCG/ACT nasal spray, Place 2 sprays into both nostrils daily., Disp: , Rfl:    guaiFENesin (MUCINEX) 600 MG 12 hr tablet, Take 600 mg by mouth 2 (two) times daily., Disp: , Rfl:    ibuprofen (ADVIL) 200 MG tablet, Take 200 mg by mouth every 6 (six) hours as needed., Disp: , Rfl:    Multiple Vitamin (MULTIVITAMIN WITH MINERALS) TABS tablet, Take 1 tablet by mouth daily., Disp: , Rfl:    amoxicillin -clavulanate (AUGMENTIN ) 875-125 MG tablet, Take 1 tablet by mouth 2 (two) times daily. (Patient not taking: Reported on 03/06/2024), Disp: 20 tablet, Rfl: 0   buPROPion  (WELLBUTRIN  XL) 300 MG 24 hr tablet, Take 1 tablet (300 mg total) by mouth daily. (Patient not taking: Reported on 03/06/2024), Disp: 90 tablet, Rfl: 3   hydrOXYzine (VISTARIL) 25 MG capsule, Take 25-50 mg  by mouth 2 (two) times daily as needed. (Patient not taking: Reported on 03/06/2024), Disp: , Rfl:    mupirocin  ointment (BACTROBAN ) 2 %, Apply 1 Application topically 2 (two) times daily. (Patient not taking: Reported on 03/06/2024), Disp: 30 g, Rfl: 2   Physical Exam:   BP 126/81 (BP Location: Left Arm, Patient Position: Sitting, Cuff Size: Normal)   Pulse 65   Ht 5\' 8"   (1.727 m)   Wt 150 lb (68 kg)   SpO2 97%   BMI 22.81 kg/m   Salient findings:  CN II-XII intact  Bilateral EAC clear and TM intact with well pneumatized middle ear spaces Anterior rhinoscopy: Septum intact; bilateral inferior turbinates with mild hypertrophy; Nasal endoscopy was indicated to better evaluate the nose and paranasal sinuses, given the patient's history and exam findings, and is detailed below. No lesions of oral cavity/oropharynx; dentition fair No obviously palpable neck masses/lymphadenopathy/thyromegaly No respiratory distress or stridor Gait normal, no nystagmus  Seprately Identifiable Procedures:  Prior to initiating any procedures, risks/benefits/alternatives were explained to the patient and verbal consent obtained. PROCEDURE: Bilateral Diagnostic Rigid Nasal Endoscopy Pre-procedure diagnosis: Concern for nasal mass, unilateral ear symptoms rule out nsaopharynx mass Post-procedure diagnosis: same Indication: See pre-procedure diagnosis and physical exam above Complications: Had a vasovagal event after, recovered within 15 seconds-- observed afterwards for 15 minutes without issue EBL: 0 mL Anesthesia: Lidocaine 4% and topical decongestant was topically sprayed in each nasal cavity  Description of Procedure:  Patient was identified. A rigid 30 degree endoscope was utilized to evaluate the sinonasal cavities, mucosa, sinus ostia and turbinates and septum.  Overall, signs of mucosal inflammation are not noted.  No mucopurulence, polyps noted Right Middle meatus: clear Right SE Recess: clear Left MM: clear Left SE Recess: clear Slight prominence midline NP, no obstructive masses over eustachian tube noted, small scar bands as below over NP    Photodocumentation was obtained.  CPT CODE -- 16109 - Mod 25   Impression & Plans:  Christian Delgado is a 38 y.o. male with:  1. Ear fullness, left   2. Dysfunction of left eustachian tube   3. Thornwaldt's cyst    No  obvious obstructive nasopharynx masses on endo; continues to have some left ear fullness, and tingling in ear; he has not gotten an audiogram; will obtain His symptoms appear to be ET related; perhaps referred? No mastoid effusion or retrocochlear lesions noted We discussed options and will start with conservative management - autoinsufflate ears, flonase and astelin  BID start MRI images appear to be consistent with thornwald's cyst F/u in 4-6 weeks with audio for recheck  See below regarding exact medications prescribed this encounter including dosages and route: Meds ordered this encounter  Medications   azelastine  (ASTELIN ) 0.1 % nasal spray    Sig: Place 2 sprays into both nostrils 2 (two) times daily. Use in each nostril as directed    Dispense:  30 mL    Refill:  12    Thank you for allowing me the opportunity to care for your patient. Please do not hesitate to contact me should you have any other questions.  Sincerely, Milon Aloe, MD Otolaryngologist (ENT), [facility] Phone: [phone] Fax: [phone]  03/30/2024, 9:28 PM   MDM:  Level 4 - [phone] Complexity/Problems addressed: mod - chronic issues Data complexity: mod - independent review of notes, labs; review of tests and ordering tests - Morbidity: mod  - Prescription Drug prescribed or managed: yes

## 2024-03-06 NOTE — Patient Instructions (Addendum)
 Use flonase two sprays each nostril twice daily; right after, use astelin nasal spray two sprays each nostril twice daily Pop your ears multiple times per day

## 2024-03-11 ENCOUNTER — Ambulatory Visit (INDEPENDENT_AMBULATORY_CARE_PROVIDER_SITE_OTHER): Admitting: Audiology

## 2024-03-11 DIAGNOSIS — H93292 Other abnormal auditory perceptions, left ear: Secondary | ICD-10-CM

## 2024-03-11 DIAGNOSIS — Z0110 Encounter for examination of ears and hearing without abnormal findings: Secondary | ICD-10-CM

## 2024-03-11 NOTE — Progress Notes (Signed)
  [address] [place], [place] [address] [phone]  Audiological Evaluation    Name: Christian Delgado     DOB:   1986/06/04      MRN:   [id number]                                                                                     Service Date: 03/11/2024     Accompanied by: unaccompanied    Patient comes today after Dr. Lydia Sams, ENT sent a referral for a hearing evaluation due to concerns with tinnitus.   Symptoms Yes Details  Hearing loss  []  Not reported  Tinnitus  [x]  Left tinnitus   Ear pain/ infections/pressure  [x]  Left burning/pressure/pain sensation with onset November 2024. Reports cannot move jaw/pop ears.   Reports burning sensation and tinnitus is relieved by applying pressure to the tragus area or behind the ear (mastoid area).  Balance problems  []    Noise exposure history  []    Previous ear surgeries  []    Family history of hearing loss  []    Amplification  []    Other  [x]  All symptoms seem to progress as  the day goes on. Remembers that in October 2024 he had some left arm mobility problem/pain after he exercised. Rested next day  and seems to be better. However, may still feel tightness around his left shoulder/neck.    Also reports headaches localized to left side of his ear/temple/temporal bone area (?).  Seeing a Land- he said that eyes closed + marching showed a right sway.    Otoscopy: Right ear: Clear external ear canals and notable landmarks visualized on the tympanic membrane. Left ear:  Clear external ear canals and notable landmarks visualized on the tympanic membrane.  Tympanometry: Right ear: Type A- Normal external ear canal volume with normal middle ear pressure and tympanic membrane compliance Left ear: Type A- Normal external ear canal volume with normal middle ear pressure and tympanic membrane compliance  Distortion Product Otoacoustic Emissions: Equipment not available.  Pure tone Audiometry: Both ears -  Normal hearing from 125-12kHz.  Speech Audiometry: Right ear- Speech Reception Threshold (SRT) was obtained at 5 dBHL. Left ear-Speech Reception Threshold (SRT) was obtained at 5 dBHL.   Word Recognition Score Tested using NU-6 (MLV) Right ear: 100% was obtained at a presentation level of 50 dBHL with contralateral masking which is deemed as  excellent. Left ear: 100% was obtained at a presentation level of 50 dBHL with contralateral masking which is deemed as  excellent.   The hearing test results were completed under headphones and results are deemed to be of good reliability. Test technique:  conventional     Recommendations: Follow up with ENT as scheduled. Return for a hearing evaluation if concerns with hearing changes arise or per MD recommendation.   Anala Whisenant MARIE LEROUX-MARTINEZ, AUD

## 2024-04-28 ENCOUNTER — Ambulatory Visit (INDEPENDENT_AMBULATORY_CARE_PROVIDER_SITE_OTHER): Admitting: Otolaryngology

## 2024-11-17 ENCOUNTER — Encounter: Payer: Self-pay | Admitting: Audiology
